# Patient Record
Sex: Male | Born: 1955
Health system: Southern US, Community
[De-identification: ages and names within clinical notes are randomized; demographics above are authoritative.]

## PROBLEM LIST (undated history)

## (undated) DIAGNOSIS — R519 Headache, unspecified: Secondary | ICD-10-CM

## (undated) DIAGNOSIS — IMO0001 Reserved for inherently not codable concepts without codable children: Secondary | ICD-10-CM

## (undated) DIAGNOSIS — I10 Essential (primary) hypertension: Secondary | ICD-10-CM

## (undated) DIAGNOSIS — F1011 Alcohol abuse, in remission: Secondary | ICD-10-CM

## (undated) DIAGNOSIS — R51 Headache: Secondary | ICD-10-CM

## (undated) DIAGNOSIS — I639 Cerebral infarction, unspecified: Secondary | ICD-10-CM

## (undated) DIAGNOSIS — G5602 Carpal tunnel syndrome, left upper limb: Secondary | ICD-10-CM

## (undated) HISTORY — PX: COLONOSCOPY: SHX174

## (undated) HISTORY — PX: SHOULDER SURGERY: SHX246

---

## 2008-08-21 ENCOUNTER — Emergency Department (HOSPITAL_COMMUNITY): Admission: EM | Admit: 2008-08-21 | Discharge: 2008-08-21 | Payer: Self-pay | Admitting: Emergency Medicine

## 2008-09-06 ENCOUNTER — Emergency Department (HOSPITAL_COMMUNITY): Admission: EM | Admit: 2008-09-06 | Discharge: 2008-09-06 | Payer: Self-pay | Admitting: Emergency Medicine

## 2008-09-09 ENCOUNTER — Emergency Department (HOSPITAL_COMMUNITY): Admission: EM | Admit: 2008-09-09 | Discharge: 2008-09-09 | Payer: Self-pay | Admitting: Emergency Medicine

## 2011-08-06 LAB — URINALYSIS, ROUTINE W REFLEX MICROSCOPIC
Hgb urine dipstick: NEGATIVE
Protein, ur: NEGATIVE
Protein, ur: NEGATIVE
Urobilinogen, UA: 0.2
pH: 6

## 2011-08-06 LAB — CBC
HCT: 42.5
MCHC: 34.7
MCHC: 34.5
MCV: 94.8
MCV: 95.4
Platelets: 278
RBC: 4.49
RDW: 13.9
WBC: 6.4

## 2011-08-06 LAB — DIFFERENTIAL
Basophils Absolute: 0.2 — ABNORMAL HIGH
Basophils Absolute: 0.1
Basophils Relative: 2 — ABNORMAL HIGH
Basophils Relative: 1
Eosinophils Absolute: 0.1
Eosinophils Relative: 1
Lymphocytes Relative: 27
Lymphs Abs: 1.9
Monocytes Absolute: 0.4
Monocytes Relative: 5

## 2011-08-06 LAB — COMPREHENSIVE METABOLIC PANEL
ALT: 13
BUN: 8
CO2: 26
Calcium: 9.2
Chloride: 105
Creatinine, Ser: 0.98
GFR calc Af Amer: >60
GFR calc non Af Amer: >60
Glucose, Bld: 92
Potassium: 3.9
Sodium: 138
Total Bilirubin: 0.8

## 2011-08-06 LAB — URINE CULTURE: Colony Count: NO GROWTH

## 2012-05-24 DIAGNOSIS — R109 Unspecified abdominal pain: Secondary | ICD-10-CM

## 2013-05-02 DIAGNOSIS — R55 Syncope and collapse: Secondary | ICD-10-CM

## 2014-11-04 DIAGNOSIS — Z8673 Personal history of transient ischemic attack (TIA), and cerebral infarction without residual deficits: Secondary | ICD-10-CM | POA: Insufficient documentation

## 2014-11-04 DIAGNOSIS — I639 Cerebral infarction, unspecified: Secondary | ICD-10-CM

## 2014-11-04 HISTORY — DX: Cerebral infarction, unspecified: I63.9

## 2016-02-03 DEATH — deceased

## 2016-05-14 ENCOUNTER — Inpatient Hospital Stay (HOSPITAL_COMMUNITY)
Admission: RE | Admit: 2016-05-14 | Discharge: 2016-05-14 | Disposition: A | Discharge status: Home / Self Care | Payer: Medicaid Other | Source: Ambulatory Visit

## 2016-07-09 ENCOUNTER — Encounter (HOSPITAL_COMMUNITY)
Admission: RE | Admit: 2016-07-09 | Discharge: 2016-07-09 | Disposition: A | Discharge status: Home / Self Care | Payer: Medicaid Other | Source: Ambulatory Visit | Attending: Oral Surgery | Admitting: Oral Surgery

## 2016-07-09 ENCOUNTER — Encounter (HOSPITAL_COMMUNITY): Payer: Self-pay

## 2016-07-09 DIAGNOSIS — Z01812 Encounter for preprocedural laboratory examination: Secondary | ICD-10-CM | POA: Diagnosis present

## 2016-07-09 DIAGNOSIS — I10 Essential (primary) hypertension: Secondary | ICD-10-CM | POA: Diagnosis not present

## 2016-07-09 DIAGNOSIS — Z01810 Encounter for preprocedural cardiovascular examination: Secondary | ICD-10-CM | POA: Diagnosis not present

## 2016-07-09 HISTORY — DX: Cerebral infarction, unspecified: I63.9

## 2016-07-09 HISTORY — DX: Headache, unspecified: R51.9

## 2016-07-09 HISTORY — DX: Essential (primary) hypertension: I10

## 2016-07-09 HISTORY — DX: Headache: R51

## 2016-07-09 HISTORY — DX: Alcohol abuse, in remission: F10.11

## 2016-07-09 HISTORY — DX: Reserved for inherently not codable concepts without codable children: IMO0001

## 2016-07-09 HISTORY — DX: Carpal tunnel syndrome, left upper limb: G56.02

## 2016-07-09 LAB — CBC
HEMATOCRIT: 41.5 % (ref 39.0–52.0)
Hemoglobin: 13.8 g/dL (ref 13.0–17.0)
MCH: 29.6 pg (ref 26.0–34.0)
MCHC: 33.3 g/dL (ref 30.0–36.0)
MCV: 89.1 fL (ref 78.0–100.0)
PLATELETS: 221 10*3/uL (ref 150–400)
RBC: 4.66 MIL/uL (ref 4.22–5.81)
RDW: 13.8 % (ref 11.5–15.5)
WBC: 7.8 10*3/uL (ref 4.0–10.5)

## 2016-07-09 LAB — COMPREHENSIVE METABOLIC PANEL
ALBUMIN: 3.7 g/dL (ref 3.5–5.0)
ALT: 15 U/L — ABNORMAL LOW (ref 17–63)
ANION GAP: 6 (ref 5–15)
AST: 19 U/L (ref 15–41)
Alkaline Phosphatase: 57 U/L (ref 38–126)
BUN: 13 mg/dL (ref 6–20)
CHLORIDE: 110 mmol/L (ref 101–111)
CO2: 23 mmol/L (ref 22–32)
Calcium: 9.5 mg/dL (ref 8.9–10.3)
Creatinine, Ser: 1.03 mg/dL (ref 0.61–1.24)
GFR calc Af Amer: >60 mL/min (ref >60)
GFR calc non Af Amer: >60 mL/min (ref >60)
GLUCOSE: 94 mg/dL (ref 65–99)
POTASSIUM: 3.9 mmol/L (ref 3.5–5.1)
SODIUM: 139 mmol/L (ref 135–145)
Total Bilirubin: 0.4 mg/dL (ref 0.3–1.2)
Total Protein: 7.5 g/dL (ref 6.5–8.1)

## 2016-07-09 NOTE — Progress Notes (Signed)
PCP - Dr. Avon Gullyesfaye Fanta Cardiologist - denies  EKG - 07/09/16 Echo - 2016 - Care Everywhere Cardiac Event Monitor - 07/2015 - Care Everywhere  Stress test/Cardiac Cath - denies  Patient denies chest pain and shortness of breath at PAT appointment.

## 2016-07-09 NOTE — Pre-Procedure Instructions (Signed)
    Richard Madden  07/09/2016      [facility] [place] - [address] [place] [id number] Phone: [phone] Fax: [phone]    Your procedure is scheduled on Friday, September 8th, 2017.  [facility] Ad[facility] at 8:00 A.M.  Call this number if you have problems the morning of surgery:  [phone]   Remember:  Do not eat food or drink liquids after midnight.   Take these medicines the morning of surgery with A SIP OF WATER: None.  Stop taking: Aspirin, NSAIDS, Aleve, Naproxen, Ibuprofen, Advil, Motrin, BC's, Goody's, Fish oil, all herbal medications, and all vitamins.    Do not wear jewelry.  Do not wear lotions, powders, or colognes, or deoderant.  Men may shave face and neck.  Do not bring valuables to the hospital.  [facility] is not responsible for any belongings or valuables.  Contacts, dentures or bridgework may not be worn into surgery.  Leave your suitcase in the car.  After surgery it may be brought to your room.  For patients admitted to the hospital, discharge time will be determined by your treatment team.  Patients discharged the day of surgery will not be allowed to drive home.   Special instructions:  Preparing for Surgery.   Please read over the following fact sheets that you were given.   [facility]- Preparing For Surgery  Before surgery, you can play an important role. Because skin is not sterile, your skin needs to be as free of germs as possible. You can reduce the number of germs on your skin by washing with CHG (chlorahexidine gluconate) Soap before surgery.  CHG is an antiseptic cleaner which kills germs and bonds with the skin to continue killing germs even after washing.  Please do not use if you have an allergy to CHG or antibacterial soaps. If your skin becomes reddened/irritated stop using the CHG.  Do not shave (including legs and underarms) for at least 48 hours prior to first CHG shower. It  is OK to shave your face.  Please follow these instructions carefully.   1. Shower the NIGHT BEFORE SURGERY and the MORNING OF SURGERY with CHG.   2. If you chose to wash your hair, wash your hair first as usual with your normal shampoo.  3. After you shampoo, rinse your hair and body thoroughly to remove the shampoo.  4. Use CHG as you would any other liquid soap. You can apply CHG directly to the skin and wash gently with a scrungie or a clean washcloth.   5. Apply the CHG Soap to your body ONLY FROM THE NECK DOWN.  Do not use on open wounds or open sores. Avoid contact with your eyes, ears, mouth and genitals (private parts). Wash genitals (private parts) with your normal soap.  6. Wash thoroughly, paying special attention to the area where your surgery will be performed.  7. Thoroughly rinse your body with warm water from the neck down.  8. DO NOT shower/wash with your normal soap after using and rinsing off the CHG Soap.  9. Pat yourself dry with a CLEAN TOWEL.   10. Wear CLEAN PAJAMAS   11. Place CLEAN SHEETS on your bed the night of your first shower and DO NOT SLEEP WITH PETS.  Day of Surgery: Do not apply any deodorants/lotions. Please wear clean clothes to the hospital/surgery center.
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
## 2016-07-11 NOTE — Anesthesia Preprocedure Evaluation (Addendum)
Anesthesia Evaluation  Patient identified by MRN, date of birth, ID band Patient awake    Reviewed: Allergy & Precautions, NPO status , Patient's Chart, lab work & pertinent test results  History of Anesthesia Complications Negative for: history of anesthetic complications  Airway Mallampati: II  TM Distance: >3 FB Neck ROM: Full    Dental  (+) Dental Advisory Given, Poor Dentition, Chipped, Missing   Pulmonary former smoker,    Pulmonary exam normal breath sounds clear to auscultation       Cardiovascular hypertension, Pt. on medications Normal cardiovascular exam Rhythm:Regular Rate:Normal     Neuro/Psych  Headaches, CVA negative psych ROS   GI/Hepatic negative GI ROS, (+)     substance abuse  alcohol use,   Endo/Other  negative endocrine ROS  Renal/GU negative Renal ROS     Musculoskeletal negative musculoskeletal ROS (+)   Abdominal   Peds  Hematology negative hematology ROS (+)   Anesthesia Other Findings Day of surgery medications reviewed with the patient.  Reproductive/Obstetrics                           Anesthesia Physical Anesthesia Plan  ASA: III  Anesthesia Plan: General   Post-op Pain Management:    Induction: Intravenous  Airway Management Planned: Nasal ETT  Additional Equipment:   Intra-op Plan:   Post-operative Plan: Extubation in OR  Informed Consent: I have reviewed the patients History and Physical, chart, labs and discussed the procedure including the risks, benefits and alternatives for the proposed anesthesia with the patient or authorized representative who has indicated his/her understanding and acceptance.   Dental advisory given  Plan Discussed with: CRNA  Anesthesia Plan Comments: (Risks/benefits of general anesthesia discussed with patient including risk of damage to teeth, lips, gum, and tongue, nausea/vomiting, allergic reactions to  medications, and the possibility of heart attack, stroke and death.  All patient questions answered.  Patient wishes to proceed.)        Anesthesia Quick Evaluation

## 2016-07-11 NOTE — Progress Notes (Signed)
Pt daughter, Rito EhrlichShirley Martin, called the office after receiving a call and verbalized understanding that the pt new arrival time is 7:00A.M. tomorrow.

## 2016-07-12 ENCOUNTER — Encounter (HOSPITAL_COMMUNITY)
Admission: RE | Disposition: A | Discharge status: Home / Self Care | Payer: Self-pay | Source: Ambulatory Visit | Attending: Oral Surgery

## 2016-07-12 ENCOUNTER — Encounter (HOSPITAL_COMMUNITY): Payer: Self-pay | Admitting: Certified Registered Nurse Anesthetist

## 2016-07-12 ENCOUNTER — Ambulatory Visit (HOSPITAL_COMMUNITY): Payer: Medicaid Other | Admitting: Vascular Surgery

## 2016-07-12 ENCOUNTER — Ambulatory Visit (HOSPITAL_COMMUNITY)
Admission: RE | Admit: 2016-07-12 | Discharge: 2016-07-12 | Disposition: A | Discharge status: Home / Self Care | Payer: Medicaid Other | Source: Ambulatory Visit | Attending: Oral Surgery | Admitting: Oral Surgery

## 2016-07-12 DIAGNOSIS — I10 Essential (primary) hypertension: Secondary | ICD-10-CM | POA: Diagnosis not present

## 2016-07-12 DIAGNOSIS — Z7982 Long term (current) use of aspirin: Secondary | ICD-10-CM | POA: Insufficient documentation

## 2016-07-12 DIAGNOSIS — Z8673 Personal history of transient ischemic attack (TIA), and cerebral infarction without residual deficits: Secondary | ICD-10-CM | POA: Diagnosis not present

## 2016-07-12 DIAGNOSIS — Z87891 Personal history of nicotine dependence: Secondary | ICD-10-CM | POA: Insufficient documentation

## 2016-07-12 DIAGNOSIS — K029 Dental caries, unspecified: Secondary | ICD-10-CM | POA: Insufficient documentation

## 2016-07-12 HISTORY — PX: MULTIPLE EXTRACTIONS WITH ALVEOLOPLASTY: SHX5342

## 2016-07-12 SURGERY — MULTIPLE EXTRACTION WITH ALVEOLOPLASTY
Anesthesia: General | Site: Mouth | Laterality: Bilateral

## 2016-07-12 MED ORDER — CEFAZOLIN SODIUM 1 G IJ SOLR
INTRAMUSCULAR | Status: AC
  Filled 2016-07-12: qty 40

## 2016-07-12 MED ORDER — PHENYLEPHRINE 40 MCG/ML (10ML) SYRINGE FOR IV PUSH (FOR BLOOD PRESSURE SUPPORT)
PREFILLED_SYRINGE | INTRAVENOUS | Status: DC | PRN
  Administered 2016-07-12 (×2): 40 ug via INTRAVENOUS

## 2016-07-12 MED ORDER — ONDANSETRON HCL 4 MG/2ML IJ SOLN
INTRAMUSCULAR | Status: AC
  Filled 2016-07-12: qty 4

## 2016-07-12 MED ORDER — OXYCODONE-ACETAMINOPHEN 5-325 MG PO TABS: 1.0000 | ORAL_TABLET | ORAL | 0 refills | Status: DC | PRN

## 2016-07-12 MED ORDER — SODIUM CHLORIDE 0.9 % IJ SOLN
INTRAMUSCULAR | Status: AC
  Filled 2016-07-12: qty 20

## 2016-07-12 MED ORDER — FENTANYL CITRATE (PF) 100 MCG/2ML IJ SOLN
INTRAMUSCULAR | Status: AC
  Filled 2016-07-12: qty 2

## 2016-07-12 MED ORDER — LIDOCAINE-EPINEPHRINE 2 %-1:100000 IJ SOLN
INTRAMUSCULAR | Status: AC
  Filled 2016-07-12: qty 1

## 2016-07-12 MED ORDER — SUCCINYLCHOLINE CHLORIDE 200 MG/10ML IV SOSY
PREFILLED_SYRINGE | INTRAVENOUS | Status: AC
  Filled 2016-07-12: qty 10

## 2016-07-12 MED ORDER — MIDAZOLAM HCL 2 MG/2ML IJ SOLN
INTRAMUSCULAR | Status: AC
  Filled 2016-07-12: qty 2

## 2016-07-12 MED ORDER — PROPOFOL 10 MG/ML IV BOLUS
INTRAVENOUS | Status: DC | PRN
  Administered 2016-07-12: 150 mg via INTRAVENOUS
  Administered 2016-07-12: 30 mg via INTRAVENOUS

## 2016-07-12 MED ORDER — LIDOCAINE-EPINEPHRINE 2 %-1:100000 IJ SOLN
INTRAMUSCULAR | Status: DC | PRN
  Administered 2016-07-12: 17 mL

## 2016-07-12 MED ORDER — OXYMETAZOLINE HCL 0.05 % NA SOLN
NASAL | Status: AC
  Filled 2016-07-12: qty 15

## 2016-07-12 MED ORDER — FENTANYL CITRATE (PF) 100 MCG/2ML IJ SOLN
INTRAMUSCULAR | Status: DC | PRN
  Administered 2016-07-12: 25 ug via INTRAVENOUS
  Administered 2016-07-12: 50 ug via INTRAVENOUS
  Administered 2016-07-12: 25 ug via INTRAVENOUS
  Administered 2016-07-12 (×2): 50 ug via INTRAVENOUS

## 2016-07-12 MED ORDER — CEFAZOLIN SODIUM 1 G IJ SOLR
INTRAMUSCULAR | Status: DC | PRN
  Administered 2016-07-12: 2 g via INTRAMUSCULAR

## 2016-07-12 MED ORDER — ONDANSETRON HCL 4 MG/2ML IJ SOLN
INTRAMUSCULAR | Status: DC | PRN
  Administered 2016-07-12: 4 mg via INTRAVENOUS

## 2016-07-12 MED ORDER — SUCCINYLCHOLINE CHLORIDE 200 MG/10ML IV SOSY
PREFILLED_SYRINGE | INTRAVENOUS | Status: DC | PRN
  Administered 2016-07-12: 100 mg via INTRAVENOUS

## 2016-07-12 MED ORDER — LACTATED RINGERS IV SOLN
INTRAVENOUS | Status: DC
  Administered 2016-07-12 (×3): via INTRAVENOUS

## 2016-07-12 MED ORDER — SODIUM CHLORIDE 0.9 % IR SOLN
Status: DC | PRN
  Administered 2016-07-12: 1000 mL

## 2016-07-12 MED ORDER — MIDAZOLAM HCL 5 MG/5ML IJ SOLN
INTRAMUSCULAR | Status: DC | PRN
  Administered 2016-07-12: 2 mg via INTRAVENOUS

## 2016-07-12 MED ORDER — 0.9 % SODIUM CHLORIDE (POUR BTL) OPTIME
TOPICAL | Status: DC | PRN
  Administered 2016-07-12: 1000 mL

## 2016-07-12 MED ORDER — ONDANSETRON HCL 4 MG/2ML IJ SOLN: 4.0000 mg | Freq: Once | INTRAMUSCULAR | Status: DC | PRN

## 2016-07-12 MED ORDER — PROPOFOL 10 MG/ML IV BOLUS
INTRAVENOUS | Status: AC
  Filled 2016-07-12: qty 20

## 2016-07-12 MED ORDER — LIDOCAINE 2% (20 MG/ML) 5 ML SYRINGE
INTRAMUSCULAR | Status: DC | PRN
  Administered 2016-07-12: 100 mg via INTRAVENOUS

## 2016-07-12 MED ORDER — LIDOCAINE 2% (20 MG/ML) 5 ML SYRINGE
INTRAMUSCULAR | Status: AC
  Filled 2016-07-12: qty 10

## 2016-07-12 MED ORDER — FENTANYL CITRATE (PF) 100 MCG/2ML IJ SOLN
25.0000 ug | INTRAMUSCULAR | Status: DC | PRN
  Administered 2016-07-12 (×2): 50 ug via INTRAVENOUS

## 2016-07-12 SURGICAL SUPPLY — 28 items
BUR CROSS CUT FISSURE 1.6 (BURR) ×2 IMPLANT
BUR CROSS CUT FISSURE 1.6MM (BURR) ×1
BUR EGG ELITE 4.0 (BURR) ×2 IMPLANT
BUR EGG ELITE 4.0MM (BURR) ×1
CANISTER SUCTION 2500CC (MISCELLANEOUS) ×3 IMPLANT
COVER SURGICAL LIGHT HANDLE (MISCELLANEOUS) ×3 IMPLANT
CRADLE DONUT ADULT HEAD (MISCELLANEOUS) ×3 IMPLANT
DRAPE U-SHAPE 76X120 STRL (DRAPES) ×3 IMPLANT
FLUID NSS /IRRIG 1000 ML XXX (MISCELLANEOUS) ×3 IMPLANT
GAUZE PACKING FOLDED 2  STR (GAUZE/BANDAGES/DRESSINGS) ×2
GAUZE PACKING FOLDED 2 STR (GAUZE/BANDAGES/DRESSINGS) ×1 IMPLANT
GLOVE BIO SURGEON STRL SZ 6.5 (GLOVE) ×2 IMPLANT
GLOVE BIO SURGEON STRL SZ7.5 (GLOVE) ×3 IMPLANT
GLOVE BIO SURGEONS STRL SZ 6.5 (GLOVE) ×1
GOWN STRL REUS W/ TWL LRG LVL3 (GOWN DISPOSABLE) ×1 IMPLANT
GOWN STRL REUS W/ TWL XL LVL3 (GOWN DISPOSABLE) ×1 IMPLANT
GOWN STRL REUS W/TWL LRG LVL3 (GOWN DISPOSABLE) ×2
GOWN STRL REUS W/TWL XL LVL3 (GOWN DISPOSABLE) ×2
KIT BASIN OR (CUSTOM PROCEDURE TRAY) ×3 IMPLANT
KIT ROOM TURNOVER OR (KITS) ×3 IMPLANT
NEEDLE 22X1 1/2 (OR ONLY) (NEEDLE) ×6 IMPLANT
NS IRRIG 1000ML POUR BTL (IV SOLUTION) ×3 IMPLANT
PAD ARMBOARD 7.5X6 YLW CONV (MISCELLANEOUS) ×3 IMPLANT
SUT CHROMIC 3 0 PS 2 (SUTURE) ×6 IMPLANT
SYR CONTROL 10ML LL (SYRINGE) ×3 IMPLANT
TRAY ENT MC OR (CUSTOM PROCEDURE TRAY) ×3 IMPLANT
TUBING IRRIGATION (MISCELLANEOUS) ×3 IMPLANT
YANKAUER SUCT BULB TIP NO VENT (SUCTIONS) ×3 IMPLANT

## 2016-07-12 NOTE — Anesthesia Postprocedure Evaluation (Signed)
Anesthesia Post Note  Patient: Richard Madden  Procedure(s) Performed: Procedure(s) (LRB): MULTIPLE EXTRACTION WITH ALVEOLOPLASTY BILATERAL (Bilateral)  Patient location during evaluation: PACU Anesthesia Type: General Level of consciousness: awake and alert Pain management: pain level controlled Vital Signs Assessment: post-procedure vital signs reviewed and stable Respiratory status: spontaneous breathing, nonlabored ventilation, respiratory function stable and patient connected to nasal cannula oxygen Cardiovascular status: blood pressure returned to baseline and stable Postop Assessment: no signs of nausea or vomiting Anesthetic complications: no    Last Vitals:  Vitals:   07/12/16 1030 07/12/16 1040  BP: 121/80 (!) 141/92  Pulse: 75 82  Resp: 13   Temp: 36.3 C     Last Pain:  Vitals:   07/12/16 1040  TempSrc:   PainSc: 2                  Richard Madden

## 2016-07-12 NOTE — Anesthesia Procedure Notes (Signed)
Procedure Name: Intubation Date/Time: 07/12/2016 8:52 AM Performed by: Annabelle HarmanSMITH, Roylene Heaton A Pre-anesthesia Checklist: Emergency Drugs available, Patient identified, Suction available and Patient being monitored Patient Re-evaluated:Patient Re-evaluated prior to inductionOxygen Delivery Method: Circle system utilized Preoxygenation: Pre-oxygenation with 100% oxygen Intubation Type: IV induction Ventilation: Mask ventilation without difficulty Laryngoscope Size: Miller and 2 Grade View: Grade I Nasal Tubes: Left, Nasal prep performed, Nasal Rae and Magill forceps- large, utilized Tube size: 7.0 mm Number of attempts: 1 Airway Equipment and Method: Stylet Tube secured with: Tape Dental Injury: Teeth and Oropharynx as per pre-operative assessment  Comments: Tape at bend in nasal RAE tube

## 2016-07-12 NOTE — H&P (Signed)
H&P documentation  -History and Physical Reviewed  -Patient has been re-examined  -No change in the plan of care  Richard Madden  

## 2016-07-12 NOTE — Op Note (Signed)
07/12/2016  9:28 AM  PATIENT:  Richard Madden  60 y.o. male  PRE-OPERATIVE DIAGNOSIS:  NON RESTORABLE TEETH # 3, 7, 8, 9, 10, 11, 14, 15, 16, 19, 22, 23, 26, 27  POST-OPERATIVE DIAGNOSIS:  SAME  PROCEDURE:  Procedure(s): MULTIPLE EXTRACTION TEETH # 3, 7, 8, 9, 10, 11, 14, 15, 16, 19, 22, 23, 26, 27;WITH ALVEOLOPLASTY BILATERAL  SURGEON:  Surgeon(s): Ocie DoyneScott Blanca Carreon, DDS  ANESTHESIA:   local and general  EBL:  minimal  DRAINS: none   SPECIMEN:  No Specimen  COUNTS:  YES  PLAN OF CARE: Discharge to home after PACU  PATIENT DISPOSITION:  PACU - hemodynamically stable.   PROCEDURE DETAILS: Dictation # [id number]  Richard LopesScott M. Kynzlie Madden, DMD 07/12/2016 9:28 AM

## 2016-07-12 NOTE — Transfer of Care (Signed)
Immediate Anesthesia Transfer of Care Note  Patient: Richard LawrenceJames M Brockway  Procedure(s) Performed: Procedure(s): MULTIPLE EXTRACTION WITH ALVEOLOPLASTY BILATERAL (Bilateral)  Patient Location: PACU  Anesthesia Type:General  Level of Consciousness: awake, alert , oriented and patient cooperative  Airway & Oxygen Therapy: Patient Spontanous Breathing and Patient connected to face mask oxygen  Post-op Assessment: Report given to RN and Post -op Vital signs reviewed and stable  Post vital signs: Reviewed and stable  Last Vitals:  Vitals:   07/12/16 0654 07/12/16 0945  BP: (!) 168/90   Pulse: 71   Resp: 20   Temp: 36.6 C 36.1 C    Last Pain:  Vitals:   07/12/16 0817  TempSrc:   PainSc: 0-No pain         Complications: No apparent anesthesia complications

## 2016-07-12 NOTE — Progress Notes (Signed)
Reporting to Parker Hannifinaylor RN as primary

## 2016-07-13 NOTE — Op Note (Signed)
NAME:  Jill PolingHOMPSON, Shay              ACCOUNT NO.:  [id number]  MEDICAL RECORD NO.:  [id number]  LOCATION:  [facility]                         FACILITY:  [facility]  PHYSICIAN:  Georgia LopesScott M. Ying Blankenhorn, M.D.  DATE OF BIRTH:  [birth year]  DATE OF PROCEDURE:  07/12/2016 DATE OF DISCHARGE:  07/12/2016                              OPERATIVE REPORT   POSTOPERATIVE DIAGNOSIS:  Nonrestorable teeth secondary to dental caries and periodontitis #3, 7, 8, 9, 10, 11, 14, 15, 16, 19, 22, 23, 26, 27.  PROCEDURE:  Extraction teeth numbers 3, 7, 9, 10, 11, 14, 15, 16, 19, 22, 23, 26, 27, alveoplasty right and left maxilla.  SURGEON:  Georgia LopesScott M. Mcdonald Reiling, M.D.  ANESTHESIA:  General nasal intubation.  DESCRIPTION OF PROCEDURE:  The patient was taken to the operating room, placed on the table in supine position.  General anesthesia was administered intravenously and a nasal endotracheal tube was placed and secured.  The eyes were protected.  The patient was draped for the procedure.  A time-out was performed.  The posterior pharynx was suctioned.  The throat pack was placed.  2% lidocaine with 1:100,000 epinephrine was infiltrated in inferior alveolar block on the right and left sides and in buccal and palatal infiltration in the maxilla around the teeth to be removed.  Additional anesthetic solution was given in the anterior mandible in the vestibule buccally.  A bite block was placed in the right side of the mouth and the left side was operated first.  #15 blade was used to make an incision around teeth #19, 22, 23, and 26, 27 in the mandible and around teeth #7, 8, 9, 10, 11, 14, 15, 16 in the maxilla.  The periosteum was reflected from around these teeth using a periosteal elevator.  Then, the teeth were elevated with a 301 elevator and removed from the mouth with a dental forceps.  Brisk hemorrhaging was experienced in the anterior mandible from small perforating arteries.  These were compressed using the rongeurs and  then alveoplasty was performed in the anterior mandible using the egg-shaped burr and bone file and in the left maxilla using the egg-shaped burr and bone file after removal of teeth #7, 8, 9, 10, 11, 14, 15, 16 with the forceps.  Then, the sockets were curetted, irrigated, and closed with 3- 0 chromic.  Then, bite block and sweetheart retractor were repositioned on either side of the mouth and a 15-blade and periosteal elevator were used to reflect the periosteum around tooth #3.  The tooth was then elevated with a 301 elevator and removed from the mouth with a dental forceps.  The sockets were curetted, irrigated, and then closed with 3-0 chromic.  The oral cavity was inspected and found to have good hemostasis and contour, and the oral cavity was irrigated, suctioned. The throat pack was removed.  The patient was awakened, taken to the recovery room, breathing spontaneously in good condition.  ESTIMATED BLOOD LOSS:  50 mL.  COMPLICATIONS:  None.  SPECIMENS:  None.     Georgia LopesScott M. Devan Babino, M.D.     SMJ/MEDQ  D:  07/12/2016  T:  07/13/2016  Job:  [id number]

## 2016-07-15 ENCOUNTER — Encounter (HOSPITAL_COMMUNITY): Payer: Self-pay | Admitting: Oral Surgery

## 2018-01-27 ENCOUNTER — Encounter (HOSPITAL_COMMUNITY): Payer: Self-pay

## 2018-01-28 ENCOUNTER — Other Ambulatory Visit: Payer: Self-pay

## 2018-01-28 ENCOUNTER — Ambulatory Visit (HOSPITAL_COMMUNITY)
Admission: RE | Admit: 2018-01-28 | Discharge: 2018-01-28 | Disposition: A | Discharge status: Home / Self Care | Payer: Medicaid Other | Source: Ambulatory Visit | Attending: Internal Medicine | Admitting: Internal Medicine

## 2018-01-28 DIAGNOSIS — I10 Essential (primary) hypertension: Secondary | ICD-10-CM | POA: Insufficient documentation

## 2018-03-08 ENCOUNTER — Observation Stay (HOSPITAL_COMMUNITY)
Admission: EM | Admit: 2018-03-08 | Discharge: 2018-03-09 | Disposition: A | Discharge status: Home / Self Care | Payer: Medicaid Other | Attending: Internal Medicine | Admitting: Internal Medicine

## 2018-03-08 ENCOUNTER — Observation Stay (HOSPITAL_COMMUNITY): Payer: Medicaid Other

## 2018-03-08 ENCOUNTER — Other Ambulatory Visit: Payer: Self-pay

## 2018-03-08 ENCOUNTER — Encounter (HOSPITAL_COMMUNITY): Payer: Self-pay | Admitting: Emergency Medicine

## 2018-03-08 DIAGNOSIS — E785 Hyperlipidemia, unspecified: Secondary | ICD-10-CM

## 2018-03-08 DIAGNOSIS — R42 Dizziness and giddiness: Secondary | ICD-10-CM

## 2018-03-08 DIAGNOSIS — H532 Diplopia: Secondary | ICD-10-CM | POA: Diagnosis not present

## 2018-03-08 DIAGNOSIS — I639 Cerebral infarction, unspecified: Secondary | ICD-10-CM

## 2018-03-08 DIAGNOSIS — I951 Orthostatic hypotension: Secondary | ICD-10-CM | POA: Diagnosis not present

## 2018-03-08 DIAGNOSIS — I10 Essential (primary) hypertension: Secondary | ICD-10-CM | POA: Diagnosis not present

## 2018-03-08 DIAGNOSIS — Z87891 Personal history of nicotine dependence: Secondary | ICD-10-CM | POA: Diagnosis not present

## 2018-03-08 DIAGNOSIS — Z79899 Other long term (current) drug therapy: Secondary | ICD-10-CM | POA: Diagnosis not present

## 2018-03-08 DIAGNOSIS — Z8673 Personal history of transient ischemic attack (TIA), and cerebral infarction without residual deficits: Secondary | ICD-10-CM | POA: Diagnosis not present

## 2018-03-08 LAB — COMPREHENSIVE METABOLIC PANEL
ALBUMIN: 3.5 g/dL (ref 3.5–5.0)
ALT: 22 U/L (ref 17–63)
AST: 21 U/L (ref 15–41)
Alkaline Phosphatase: 59 U/L (ref 38–126)
Anion gap: 7 (ref 5–15)
BILIRUBIN TOTAL: 0.8 mg/dL (ref 0.3–1.2)
BUN: 17 mg/dL (ref 6–20)
CO2: 24 mmol/L (ref 22–32)
CREATININE: 1.17 mg/dL (ref 0.61–1.24)
Calcium: 8.9 mg/dL (ref 8.9–10.3)
Chloride: 106 mmol/L (ref 101–111)
GFR calc Af Amer: >60 mL/min (ref >60)
GLUCOSE: 129 mg/dL — AB (ref 65–99)
POTASSIUM: 3.3 mmol/L — AB (ref 3.5–5.1)
Sodium: 137 mmol/L (ref 135–145)
TOTAL PROTEIN: 7.1 g/dL (ref 6.5–8.1)

## 2018-03-08 LAB — CBC WITH DIFFERENTIAL/PLATELET
BASOS ABS: 0.1 10*3/uL (ref 0.0–0.1)
BASOS PCT: 1 %
Eosinophils Absolute: 0 10*3/uL (ref 0.0–0.7)
Eosinophils Relative: 0 %
HEMATOCRIT: 42.2 % (ref 39.0–52.0)
HEMOGLOBIN: 14.1 g/dL (ref 13.0–17.0)
Lymphocytes Relative: 19 %
Lymphs Abs: 1.7 10*3/uL (ref 0.7–4.0)
MCH: 29.7 pg (ref 26.0–34.0)
MCHC: 33.4 g/dL (ref 30.0–36.0)
MCV: 88.8 fL (ref 78.0–100.0)
Monocytes Absolute: 0.5 10*3/uL (ref 0.1–1.0)
Monocytes Relative: 6 %
NEUTROS ABS: 6.4 10*3/uL (ref 1.7–7.7)
NEUTROS PCT: 74 %
Platelets: 226 10*3/uL (ref 150–400)
RBC: 4.75 MIL/uL (ref 4.22–5.81)
RDW: 13.3 % (ref 11.5–15.5)
WBC: 8.7 10*3/uL (ref 4.0–10.5)

## 2018-03-08 MED ORDER — IOPAMIDOL (ISOVUE-370) INJECTION 76%
100.0000 mL | Freq: Once | INTRAVENOUS | Status: AC | PRN
  Administered 2018-03-08: 80 mL via INTRAVENOUS

## 2018-03-08 MED ORDER — SENNOSIDES-DOCUSATE SODIUM 8.6-50 MG PO TABS: 1.0000 | ORAL_TABLET | Freq: Every evening | ORAL | Status: DC | PRN

## 2018-03-08 MED ORDER — ACETAMINOPHEN 650 MG RE SUPP: 650.0000 mg | RECTAL | Status: DC | PRN

## 2018-03-08 MED ORDER — ACETAMINOPHEN 325 MG PO TABS
650.0000 mg | ORAL_TABLET | ORAL | Status: DC | PRN
  Administered 2018-03-09: 650 mg via ORAL
  Filled 2018-03-08: qty 2

## 2018-03-08 MED ORDER — SODIUM CHLORIDE 0.9 % IV BOLUS
1000.0000 mL | Freq: Once | INTRAVENOUS | Status: AC
  Administered 2018-03-08: 1000 mL via INTRAVENOUS

## 2018-03-08 MED ORDER — ACETAMINOPHEN 160 MG/5ML PO SOLN: 650.0000 mg | ORAL | Status: DC | PRN

## 2018-03-08 MED ORDER — HYDRALAZINE HCL 20 MG/ML IJ SOLN: 5.0000 mg | INTRAMUSCULAR | Status: DC | PRN

## 2018-03-08 MED ORDER — SIMVASTATIN 20 MG PO TABS
40.0000 mg | ORAL_TABLET | Freq: Every day | ORAL | Status: DC
  Administered 2018-03-09: 40 mg via ORAL
  Filled 2018-03-08: qty 2

## 2018-03-08 MED ORDER — STROKE: EARLY STAGES OF RECOVERY BOOK
Freq: Once | Status: AC
  Administered 2018-03-09: 02:00:00

## 2018-03-08 MED ORDER — ENOXAPARIN SODIUM 40 MG/0.4ML ~~LOC~~ SOLN
40.0000 mg | SUBCUTANEOUS | Status: DC
  Administered 2018-03-09: 40 mg via SUBCUTANEOUS
  Filled 2018-03-08: qty 0.4

## 2018-03-08 NOTE — ED Notes (Signed)
To CT

## 2018-03-08 NOTE — ED Notes (Signed)
Report to Marcelino Duster, RN  Part of report is should there be a need to contact Dr Adrian Blackwater please call him Re CT
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
## 2018-03-08 NOTE — ED Notes (Signed)
Out of bed to BR 

## 2018-03-08 NOTE — ED Notes (Signed)
Call for report  RN will call back 

## 2018-03-08 NOTE — ED Notes (Signed)
Family continues to be concerned that pt is having a stroke

## 2018-03-08 NOTE — ED Notes (Signed)
To CT via stretcher

## 2018-03-08 NOTE — ED Triage Notes (Signed)
Was seen at [facility] for near syncope 0900-1600 HTN meds have been changed  Was given fluids and CT this am  Daughters concerned that pt at home had BP 60/30 Upon returning home   Also pt complains of change of vision and his eye is moving independently from the other which they report as new

## 2018-03-09 ENCOUNTER — Observation Stay (HOSPITAL_COMMUNITY): Payer: Medicaid Other

## 2018-03-09 ENCOUNTER — Other Ambulatory Visit: Payer: Self-pay

## 2018-03-09 ENCOUNTER — Observation Stay (HOSPITAL_BASED_OUTPATIENT_CLINIC_OR_DEPARTMENT_OTHER): Payer: Medicaid Other

## 2018-03-09 DIAGNOSIS — I639 Cerebral infarction, unspecified: Secondary | ICD-10-CM

## 2018-03-09 DIAGNOSIS — R42 Dizziness and giddiness: Secondary | ICD-10-CM

## 2018-03-09 DIAGNOSIS — I5030 Unspecified diastolic (congestive) heart failure: Secondary | ICD-10-CM | POA: Diagnosis not present

## 2018-03-09 LAB — ECHOCARDIOGRAM COMPLETE
Height: 65 in
Weight: 2095.25 oz

## 2018-03-09 LAB — LIPID PANEL
Cholesterol: 112 mg/dL (ref 0–200)
HDL: 44 mg/dL (ref >40)
LDL CALC: 61 mg/dL (ref 0–99)
TRIGLYCERIDES: 34 mg/dL (ref <150)
Total CHOL/HDL Ratio: 2.5 RATIO
VLDL: 7 mg/dL (ref 0–40)

## 2018-03-09 LAB — HEMOGLOBIN A1C
Hgb A1c MFr Bld: 5.7 % — ABNORMAL HIGH (ref 4.8–5.6)
Mean Plasma Glucose: 116.89 mg/dL

## 2018-03-09 LAB — VITAMIN B12: Vitamin B-12: 361 pg/mL (ref 180–914)

## 2018-03-09 LAB — TSH: TSH: 3.153 u[IU]/mL (ref 0.350–4.500)

## 2018-03-09 MED ORDER — ASPIRIN 81 MG PO CHEW
324.0000 mg | CHEWABLE_TABLET | Freq: Every day | ORAL | Status: DC
  Administered 2018-03-09: 324 mg via ORAL
  Filled 2018-03-09: qty 4

## 2018-03-09 MED ORDER — CLOPIDOGREL BISULFATE 75 MG PO TABS: 75.0000 mg | ORAL_TABLET | Freq: Every day | ORAL | 2 refills | Status: AC

## 2018-03-09 MED ORDER — STROKE: EARLY STAGES OF RECOVERY BOOK
Freq: Once | Status: AC
  Administered 2018-03-09: 14:00:00
  Filled 2018-03-09: qty 1

## 2018-03-09 NOTE — Progress Notes (Signed)
Echocardiogram 2D Echocardiogram has been performed.  Pieter Partridge 03/09/2018, 3:01 PM

## 2018-03-09 NOTE — Progress Notes (Signed)
Patient given discharge instructions at bedside, IV removed, tolerated well.

## 2018-03-09 NOTE — Discharge Summary (Signed)
Physician Discharge Summary  KIRILL CHATTERJEE ZOX:[id number] DOB: 1956-10-06 DOA: 03/08/2018  PCP: Avon Gully, MD  Admit date: 03/08/2018 Discharge date: 03/09/2018  Time spent: 45 minutes  Recommendations for Outpatient Follow-up:  -To be discharged home today. -Advised to follow-up with PCP in 2 weeks. -Also should follow-up with his ophthalmologist and his neurologist within the next 2 to 3 weeks.  Discharge Diagnoses:  Principal Problem:   Dizziness Active Problems:   Hypertension   Diplopia   Hyperlipidemia   Orthostasis   Discharge Condition: Stable and improved  Filed Weights   03/08/18 1850 03/08/18 2320  Weight: 59.9 kg (132 lb) 59.4 kg (130 lb 15.3 oz)    History of present illness:  As per Dr. Adrian Blackwater on 5/5: CARL BUTNER is a 62 y.o. male with a history of hypertension, hyperlipidemia, history of stroke in 2016.  Patient had presented at that time with multiple falls with the MRI showing left cerebellar subacute infarcts.  Additionally, the patient had been seen at [facility] for diplopia and was diagnosed with a 3rd nerve palsy on the right.  MRI did not show any acute focal abnormality.  Last night, the patient began having dizziness.  As this had continued in the morning, the patient went to [facility] for evaluation.  He was found to be hypotensive.  He was given IV fluids.  He also had a CT around noon, but around 3:00 the patient began having diplopia with difficulty controlling the movements of his left eye.  Patient was released from the hospital, told to discontinue his metoprolol and start hydralazine 10 mg once a day.  When patient arrived at home, he was found to be hypotensive there was brought here for evaluation.  Patient received 2 L of IV fluids with good improvement, although due to family concerns,  I was asked to evaluate the patient for observation.  Patient continues to have diplopia.  His dizziness is worse when he was up and  moving around, although continues to have some dizziness at rest.  This is improved since receiving the 2 L of IV fluids.  He also reports difficulty in controlling the movements of his left eye.  He did have a headache earlier, although this is improved.  He reports no changes to his blood pressure medications over the past couple of weeks, other than that which was done at [facility] earlier today.    Hospital Course:   Acute CVA -MRI shows small area of infarct in the right posterior midbrain just above the fourth ventricle with chronic ischemic changes in the white matter, basal ganglia, pons and left cerebellum. -Carotid Dopplers with mild stenosis of bilateral ICAs with patent vertebral arteries. -2D echo shows an ejection fraction of 65 to 70% with normal wall motion and grade 1 diastolic dysfunction.  No PFO identified. -Patient has significant diplopia as his main side effect from this event. -Seen by neurology. -We will add Plavix to aspirin for secondary stroke prevention. -Patient advised to follow-up with his outpatient neurologist and ophthalmologist. -  Evaluated by therapy services without need for outpatient follow-up.  Hyperlipidemia -Continue statin.   Procedures:  As above  Consultations:  Neurology  Discharge Instructions  Discharge Instructions    Diet - low sodium heart healthy   Complete by:  As directed    Increase activity slowly   Complete by:  As directed      Allergies as of 03/09/2018      Reactions  No Known Allergies       Medication List    STOP taking these medications   lisinopril 10 MG tablet Commonly known as:  PRINIVIL,ZESTRIL     TAKE these medications   clopidogrel 75 MG tablet Commonly known as:  PLAVIX Take 1 tablet (75 mg total) by mouth daily.   ECOTRIN PO Take 1 tablet by mouth daily.   hydrALAZINE 25 MG tablet Commonly known as:  APRESOLINE Take 25 mg by mouth 3 (three) times daily.   meclizine 25 MG  tablet Commonly known as:  ANTIVERT TAKE ONE TABLET BY MOUTH EVERY 6 TO 8 HOURS   metoprolol tartrate 25 MG tablet Commonly known as:  LOPRESSOR Take 25 mg by mouth 2 (two) times daily.   oxyCODONE-acetaminophen 5-325 MG tablet Commonly known as:  PERCOCET Take 1-2 tablets by mouth every 4 (four) hours as needed for severe pain.   simvastatin 40 MG tablet Commonly known as:  ZOCOR Take 40 mg by mouth daily.      Allergies  Allergen Reactions  . No Known Allergies    Follow-up Information    Avon Gully, MD. Schedule an appointment as soon as possible for a visit in 2 week(s).   Specialty:  Internal Medicine Contact information: [address] [place] [address] [phone]            The results of significant diagnostics from this hospitalization (including imaging, microbiology, ancillary and laboratory) are listed below for reference.    Significant Diagnostic Studies: Ct Angio Head W Or Wo Contrast  Result Date: 03/08/2018 CLINICAL DATA:  62 y/o M; 1 day of intermittent dizziness. History of stroke. EXAM: CT ANGIOGRAPHY HEAD AND NECK TECHNIQUE: Multidetector CT imaging of the head and neck was performed using the standard protocol during bolus administration of intravenous contrast. Multiplanar CT image reconstructions and MIPs were obtained to evaluate the vascular anatomy. Carotid stenosis measurements (when applicable) are obtained utilizing NASCET criteria, using the distal internal carotid diameter as the denominator. CONTRAST:  80mL ISOVUE-370 IOPAMIDOL (ISOVUE-370) INJECTION 76% COMPARISON:  03/08/2018 CT head FINDINGS: CT HEAD FINDINGS Brain: No evidence of acute infarction, hemorrhage, hydrocephalus, extra-axial collection or mass lesion/mass effect. Very small chronic infarctions in left cerebellar hemisphere and chronic lacunar infarctions within bilateral thalami are stable. Stable background of chronic microvascular ischemic changes and  parenchymal volume loss of the brain. Vascular: As below. Skull: Normal. Negative for fracture or focal lesion. Sinuses: Partial opacification of the left mastoid tip. Otherwise negative. Orbits: No acute finding. Review of the MIP images confirms the above findings CTA NECK FINDINGS Aortic arch: Standard branching. Imaged portion shows no evidence of aneurysm or dissection. No significant stenosis of the major arch vessel origins. Mild calcific atherosclerosis. Right carotid system: No evidence of dissection, stenosis (50% or greater) or occlusion. Mild non stenotic calcified plaque of the carotid bifurcation. Left carotid system: No evidence of dissection, stenosis (50% or greater) or occlusion. Mild non stenotic calcified plaque of the carotid bifurcation. Vertebral arteries: Right vertebral artery origin mixed plaque with moderate 50% stenosis. Left vertebral artery origin calcified plaque without significant stenosis. Left dominant vertebral system. No evidence for dissection, aneurysm, or occlusion. Skeleton: Negative. Other neck: Negative. Upper chest: Mild centrilobular emphysema of the lungs. Review of the MIP images confirms the above findings CTA HEAD FINDINGS Anterior circulation: No large vessel occlusion, aneurysm, or vascular malformation. Calcified plaque of the carotid siphons without significant stenosis. Mild stenosis in the left A3, left superior M2 and  right inferior M2. No high-grade stenosis. Posterior circulation: No large vessel occlusion, aneurysm, or vascular malformation. No significant stenosis of bilateral vertebral arteries, basilar artery, or posterior cerebral arteries. Tandem segments of severe stenosis in the right PICA. Venous sinuses: As permitted by contrast timing, patent. Anatomic variants: Patent left posterior communicating artery and anterior communicating artery. No right posterior communicating artery identified, likely hypoplastic or absent. Delayed phase: No abnormal  intracranial enhancement. Review of the MIP images confirms the above findings IMPRESSION: 1. No acute abnormality on noncontrast CT of head. Stable chronic microvascular ischemic changes and atrophy of the brain as well as small chronic infarcts in the thalami and left cerebellum. 2. Right vertebral artery origin moderate 50% stenosis. 3. No significant stenosis by NASCET criteria of the left vertebral artery or carotid systems. No evidence for dissection, aneurysm, or occlusion. 4. Patent anterior and posterior intracranial circulation. No large vessel occlusion, aneurysm, or vascular malformation. 5. Tandem segments of severe stenosis in right PICA. Otherwise, no high-grade stenosis of the intracranial circulation. 6. Mild centrilobular emphysema of the lungs. Electronically Signed   By: Mitzi Hansen M.D.   On: 03/08/2018 23:14   Ct Angio Neck W Or Wo Contrast  Result Date: 03/08/2018 CLINICAL DATA:  62 y/o M; 1 day of intermittent dizziness. History of stroke. EXAM: CT ANGIOGRAPHY HEAD AND NECK TECHNIQUE: Multidetector CT imaging of the head and neck was performed using the standard protocol during bolus administration of intravenous contrast. Multiplanar CT image reconstructions and MIPs were obtained to evaluate the vascular anatomy. Carotid stenosis measurements (when applicable) are obtained utilizing NASCET criteria, using the distal internal carotid diameter as the denominator. CONTRAST:  80mL ISOVUE-370 IOPAMIDOL (ISOVUE-370) INJECTION 76% COMPARISON:  03/08/2018 CT head FINDINGS: CT HEAD FINDINGS Brain: No evidence of acute infarction, hemorrhage, hydrocephalus, extra-axial collection or mass lesion/mass effect. Very small chronic infarctions in left cerebellar hemisphere and chronic lacunar infarctions within bilateral thalami are stable. Stable background of chronic microvascular ischemic changes and parenchymal volume loss of the brain. Vascular: As below. Skull: Normal. Negative for  fracture or focal lesion. Sinuses: Partial opacification of the left mastoid tip. Otherwise negative. Orbits: No acute finding. Review of the MIP images confirms the above findings CTA NECK FINDINGS Aortic arch: Standard branching. Imaged portion shows no evidence of aneurysm or dissection. No significant stenosis of the major arch vessel origins. Mild calcific atherosclerosis. Right carotid system: No evidence of dissection, stenosis (50% or greater) or occlusion. Mild non stenotic calcified plaque of the carotid bifurcation. Left carotid system: No evidence of dissection, stenosis (50% or greater) or occlusion. Mild non stenotic calcified plaque of the carotid bifurcation. Vertebral arteries: Right vertebral artery origin mixed plaque with moderate 50% stenosis. Left vertebral artery origin calcified plaque without significant stenosis. Left dominant vertebral system. No evidence for dissection, aneurysm, or occlusion. Skeleton: Negative. Other neck: Negative. Upper chest: Mild centrilobular emphysema of the lungs. Review of the MIP images confirms the above findings CTA HEAD FINDINGS Anterior circulation: No large vessel occlusion, aneurysm, or vascular malformation. Calcified plaque of the carotid siphons without significant stenosis. Mild stenosis in the left A3, left superior M2 and right inferior M2. No high-grade stenosis. Posterior circulation: No large vessel occlusion, aneurysm, or vascular malformation. No significant stenosis of bilateral vertebral arteries, basilar artery, or posterior cerebral arteries. Tandem segments of severe stenosis in the right PICA. Venous sinuses: As permitted by contrast timing, patent. Anatomic variants: Patent left posterior communicating artery and anterior communicating artery. No right posterior communicating  artery identified, likely hypoplastic or absent. Delayed phase: No abnormal intracranial enhancement. Review of the MIP images confirms the above findings  IMPRESSION: 1. No acute abnormality on noncontrast CT of head. Stable chronic microvascular ischemic changes and atrophy of the brain as well as small chronic infarcts in the thalami and left cerebellum. 2. Right vertebral artery origin moderate 50% stenosis. 3. No significant stenosis by NASCET criteria of the left vertebral artery or carotid systems. No evidence for dissection, aneurysm, or occlusion. 4. Patent anterior and posterior intracranial circulation. No large vessel occlusion, aneurysm, or vascular malformation. 5. Tandem segments of severe stenosis in right PICA. Otherwise, no high-grade stenosis of the intracranial circulation. 6. Mild centrilobular emphysema of the lungs. Electronically Signed   By: Mitzi Hansen M.D.   On: 03/08/2018 23:14   Mr Brain Wo Contrast  Result Date: 03/09/2018 CLINICAL DATA:  Ataxia.  Left-sided weakness.  Stroke. EXAM: MRI HEAD WITHOUT CONTRAST TECHNIQUE: Multiplanar, multiecho pulse sequences of the brain and surrounding structures were obtained without intravenous contrast. COMPARISON:  CT head 03/08/2018.  MRI 10/17/2017 FINDINGS: Brain: Small acute infarct in the right posterior midbrain at the level the upper fourth ventricle. No other acute infarct identified. Mild atrophy. Chronic microvascular ischemic change throughout the white matter. Chronic infarcts in the thalamus and pons bilaterally. Chronic infarcts in the left cerebellum. Negative for hemorrhage or mass lesion. Vascular: Normal arterial flow voids Skull and upper cervical spine: Negative Sinuses/Orbits: Mild mucosal edema paranasal sinuses.  Normal orbit Other: None IMPRESSION: Small area of acute infarct in the right posterior midbrain just above the fourth ventricle. Chronic ischemic changes in the white matter, basal ganglia, pons, and left cerebellum Electronically Signed   By: Marlan Palau M.D.   On: 03/09/2018 09:36   US Carotid Bilateral  Result Date: 03/09/2018 CLINICAL DATA:   62 year old male with new acute brainstem infarct. EXAM: BILATERAL CAROTID DUPLEX ULTRASOUND TECHNIQUE: Wallace Cullens scale imaging, color Doppler and duplex ultrasound were performed of bilateral carotid and vertebral arteries in the neck. COMPARISON:  Most recent prior carotid duplex ultrasound 05/12/2017 FINDINGS: Criteria: Quantification of carotid stenosis is based on velocity parameters that correlate the residual internal carotid diameter with NASCET-based stenosis levels, using the diameter of the distal internal carotid lumen as the denominator for stenosis measurement. The following velocity measurements were obtained: RIGHT ICA: 72/26 cm/sec CCA: 103/17 cm/sec SYSTOLIC ICA/CCA RATIO:  0.7 ECA:  105 cm/sec LEFT ICA: 73/20 cm/sec CCA: 91/15 cm/sec SYSTOLIC ICA/CCA RATIO:  0.8 ECA:  80 cm/sec RIGHT CAROTID ARTERY: Trace smooth heterogeneous atherosclerotic plaque. By peak systolic velocity criteria, stenosis remains less than 50%. RIGHT VERTEBRAL ARTERY:  Patent with normal antegrade flow. LEFT CAROTID ARTERY: Mild heterogeneous atherosclerotic plaque in the proximal internal carotid artery. By peak systolic velocity criteria, the estimated stenosis remains less than 50%. LEFT VERTEBRAL ARTERY:  Patent with normal antegrade flow. IMPRESSION: 1. Mild (1-49%) stenosis proximal right internal carotid artery secondary to trace heterogeneous atherosclerotic plaque without significant interval progression compared to July 2018. 2. Mild (1-49%) stenosis proximal left internal carotid artery secondary to mild heterogeneous atherosclerotic plaque without significant interval progression. 3. Vertebral arteries remain patent with normal antegrade flow. Signed, Sterling Big, MD [facility] Electronically Signed   By: Malachy Moan M.D.   On: 03/09/2018 13:01    Microbiology: No results found for this or any previous visit (from the past 240 hour(s)).    Labs: Basic Metabolic Panel: Recent Labs  Lab 03/08/18  1950  NA 137  K 3.3*  CL 106  CO2 24  GLUCOSE 129*  BUN 17  CREATININE 1.17  CALCIUM 8.9   Liver Function Tests: Recent Labs  Lab 03/08/18 1950  AST 21  ALT 22  ALKPHOS 59  BILITOT 0.8  PROT 7.1  ALBUMIN 3.5   No results for input(s): LIPASE, AMYLASE in the last 168 hours. No results for input(s): AMMONIA in the last 168 hours. CBC: Recent Labs  Lab 03/08/18 1950  WBC 8.7  NEUTROABS 6.4  HGB 14.1  HCT 42.2  MCV 88.8  PLT 226   Cardiac Enzymes: No results for input(s): CKTOTAL, CKMB, CKMBINDEX, TROPONINI in the last 168 hours. BNP: BNP (last 3 results) No results for input(s): BNP in the last 8760 hours.  ProBNP (last 3 results) No results for input(s): PROBNP in the last 8760 hours.  CBG: No results for input(s): GLUCAP in the last 168 hours.     Signed:  Chaya Jan  [facility] Pager: [phone] 03/09/2018, 6:18 PM

## 2018-03-09 NOTE — Evaluation (Addendum)
Occupational Therapy Evaluation Patient Details Name: Richard Madden MRN: [id number] DOB: 02/13/1956 Today's Date: 03/09/2018    History of Present Illness Richard Madden is a 62 y.o. male with a history of hypertension, hyperlipidemia, history of stroke in 2016.  Patient had presented at that time with multiple falls with the MRI showing left cerebellar subacute infarcts.  Additionally, the patient had been seen at [facility] for diplopia and was diagnosed with a 3rd nerve palsy on the right.  MRI did not show any acute focal abnormality.Patient continues to have diplopia.  His dizziness is worse when he was up and moving around, although continues to have some dizziness at rest.  This is improved since receiving the 2 L of IV fluids.  He also reports difficulty in controlling the movements of his left eye.  He did have a headache earlier, although this is improved.  He reports no changes to his blood pressure medications over the past couple of weeks, other than that which was done at [facility] earlier today.   Clinical Impression   Pt in bed and agreeable to participate in OT services. Patient at this time is functioning at baseline and does not require any additional OT services. Patient will be safe to return home. I do recommend that patient obtain a shower chair at home.     Follow Up Recommendations  No OT follow up    Equipment Recommendations  Tub/shower seat       Precautions / Restrictions Precautions Precautions: None Restrictions Weight Bearing Restrictions: No      Mobility Bed Mobility Overal bed mobility: Independent                Transfers Overall transfer level: Independent Equipment used: None                      ADL either performed or assessed with clinical judgement   ADL Overall ADL's : Independent;At baseline             Vision Baseline Vision/History: No visual deficits Patient Visual Report: No change from  baseline Vision Assessment?: No apparent visual deficits Additional Comments: Pt reports that his diplopia has resolved since he initially arrived to the hospital.             Pertinent Vitals/Pain Pain Assessment: 0-10 Pain Score: 4  Pain Location: left side of head Pain Descriptors / Indicators: Aching Pain Intervention(s): Patient requesting pain meds-RN notified     Hand Dominance Right   Extremity/Trunk Assessment Upper Extremity Assessment Upper Extremity Assessment: Overall WFL for tasks assessed   Lower Extremity Assessment Lower Extremity Assessment: Defer to PT evaluation       Communication Communication Communication: No difficulties   Cognition Arousal/Alertness: Awake/alert Behavior During Therapy: WFL for tasks assessed/performed Overall Cognitive Status: Within Functional Limits for tasks assessed                        Home Living Family/patient expects to be discharged to:: Private residence Living Arrangements: Alone Available Help at Discharge: Family;Available PRN/intermittently Type of Home: House Home Access: Stairs to enter [employer] of Steps: 3 Entrance Stairs-Rails: Right Home Layout: One level     Bathroom Shower/Tub: Producer, television/film/video: Standard     Home Equipment: None   Additional Comments: Pt reports that he has a walking stick that he uses outside of the home.       Prior  Functioning/Environment Level of Independence: Independent                          OT Goals(Current goals can be found in the care plan section) Acute Rehab OT Goals Patient Stated Goal: To go home.  OT Frequency:             Co-evaluation PT/OT/SLP Co-Evaluation/Treatment: Yes Reason for Co-Treatment: To address functional/ADL transfers   OT goals addressed during session: ADL's and self-care;Strengthening/ROM      AM-PAC PT "6 Clicks" Daily Activity     Outcome Measure Help from another person  eating meals?: None Help from another person taking care of personal grooming?: None Help from another person toileting, which includes using toliet, bedpan, or urinal?: None Help from another person bathing (including washing, rinsing, drying)?: None Help from another person to put on and taking off regular upper body clothing?: None Help from another person to put on and taking off regular lower body clothing?: None 6 Click Score: 24   End of Session Equipment Utilized During Treatment: Gait belt Nurse Communication: Patient requests pain meds  Activity Tolerance: Patient tolerated treatment well Patient left: Other (comment);in bed;with call bell/phone within reach(Physical therapist)  OT Visit Diagnosis: Muscle weakness (generalized) (M62.81)                Time: 1610-9604 OT Time Calculation (min): 24 min Charges:  OT General Charges $OT Visit: 1 Visit OT Evaluation $OT Eval Low Complexity: 1 Low G-Codes:     Limmie Patricia, OTR/L,CBIS  [phone]   Tamarick Kovalcik, Charisse March 03/09/2018, 9:04 AM

## 2018-03-09 NOTE — Evaluation (Signed)
Physical Therapy Evaluation Patient Details Name: Richard Madden MRN: [id number] DOB: [birth year] Today's Date: 03/09/2018   History of Present Illness  Richard Madden is a 62 y.o. male with a history of hypertension, hyperlipidemia, history of stroke in 2016.  Patient had presented at that time with multiple falls with the MRI showing left cerebellar subacute infarcts.  Additionally, the patient had been seen at [facility] for diplopia and was diagnosed with a 3rd nerve palsy on the right.  MRI did not show any acute focal abnormality.Patient continues to have diplopia.  His dizziness is worse when he was up and moving around, although continues to have some dizziness at rest.  This is improved since receiving the 2 L of IV fluids.  He also reports difficulty in controlling the movements of his left eye.  He did have a headache earlier, although this is improved.  He reports no changes to his blood pressure medications over the past couple of weeks, other than that which was done at [facility] earlier today.    Clinical Impression  Patient functioning at baseline for functional mobility and gait other than tendency to lean on walls for support when having an episode of double vision in left eye, otherwise patient is at baseline.  Plan:  Patient discharged from physical therapy to care of nursing for ambulation daily as tolerated for length of stay.     Follow Up Recommendations No PT follow up    Equipment Recommendations  None recommended by PT    Recommendations for Other Services       Precautions / Restrictions Precautions Precautions: Fall Precaution Comments: possible fall risk when having episodes of double vision Restrictions Weight Bearing Restrictions: No      Mobility  Bed Mobility Overal bed mobility: Independent                Transfers Overall transfer level: Independent Equipment used: None                Ambulation/Gait Ambulation/Gait  assistance: Modified independent (Device/Increase time) Ambulation Distance (Feet): 200 Feet Assistive device: None Gait Pattern/deviations: WFL(Within Functional Limits) Gait velocity: normal   General Gait Details: grossly WFL except tendency to lean on walls once having episode of double vision in left eye wehn returning to his room, other than that at baseline  Stairs Stairs: Yes Stairs assistance: Modified independent (Device/Increase time) Stair Management: Two rails;Alternating pattern Number of Stairs: 9 General stair comments: Patient demonstrates good return for going up/down 9 steps using bilateral siderails without loss of balance  Wheelchair Mobility    Modified Rankin (Stroke Patients Only)       Balance Overall balance assessment: No apparent balance deficits (not formally assessed)                                           Pertinent Vitals/Pain Pain Assessment: 0-10 Pain Score: 4  Pain Location: left side of head Pain Descriptors / Indicators: Aching Pain Intervention(s): Limited activity within patient's tolerance;Monitored during session    Home Living Family/patient expects to be discharged to:: Private residence Living Arrangements: Alone Available Help at Discharge: Family;Available PRN/intermittently Type of Home: House Home Access: Stairs to enter Entrance Stairs-Rails: Right Entrance Stairs-Number of Steps: 3 Home Layout: One level Home Equipment: Other (comment)(walking stick) Additional Comments: Pt reports that he has a walking stick that he uses  outside of the home.     Prior Function Level of Independence: Independent with assistive device(s)         Comments: household ambulation without assistive device, uses walking stick for community     Hand Dominance   Dominant Hand: Right    Extremity/Trunk Assessment   Upper Extremity Assessment Upper Extremity Assessment: Defer to OT evaluation    Lower Extremity  Assessment Lower Extremity Assessment: Overall WFL for tasks assessed    Cervical / Trunk Assessment Cervical / Trunk Assessment: Normal  Communication   Communication: No difficulties  Cognition Arousal/Alertness: Awake/alert Behavior During Therapy: WFL for tasks assessed/performed Overall Cognitive Status: Within Functional Limits for tasks assessed                                        General Comments      Exercises     Assessment/Plan    PT Assessment Patent does not need any further PT services  PT Problem List         PT Treatment Interventions      PT Goals (Current goals can be found in the Care Plan section)  Acute Rehab PT Goals Patient Stated Goal: To go home. PT Goal Formulation: With patient Time For Goal Achievement: Mar 17, 2018 Potential to Achieve Goals: Good    Frequency     Barriers to discharge        Co-evaluation PT/OT/SLP Co-Evaluation/Treatment: Yes Reason for Co-Treatment: Complexity of the patient's impairments (multi-system involvement) PT goals addressed during session: Mobility/safety with mobility;Balance OT goals addressed during session: ADL's and self-care;Strengthening/ROM       AM-PAC PT "6 Clicks" Daily Activity  Outcome Measure Difficulty turning over in bed (including adjusting bedclothes, sheets and blankets)?: None Difficulty moving from lying on back to sitting on the side of the bed? : None Difficulty sitting down on and standing up from a chair with arms (e.g., wheelchair, bedside commode, etc,.)?: None Help needed moving to and from a bed to chair (including a wheelchair)?: None Help needed walking in hospital room?: None Help needed climbing 3-5 steps with a railing? : None 6 Click Score: 24    End of Session Equipment Utilized During Treatment: Gait belt Activity Tolerance: Patient tolerated treatment well Patient left: in bed;with call bell/phone within reach;with bed alarm set Nurse  Communication: Mobility status PT Visit Diagnosis: Unsteadiness on feet (R26.81);Other abnormalities of gait and mobility (R26.89);Muscle weakness (generalized) (M62.81)    Time: 6213-0865 PT Time Calculation (min) (ACUTE ONLY): 23 min   Charges:   PT Evaluation $PT Eval Moderate Complexity: 1 Mod PT Treatments $Therapeutic Activity: 23-37 mins   PT G Codes:        10:49 AM, Mar 17, 2018 Ocie Bob, MPT Physical Therapist with [facility] [phone] office [phone] mobile phone
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
## 2018-03-10 LAB — HOMOCYSTEINE: Homocysteine: 8.2 umol/L (ref 0.0–15.0)

## 2018-03-10 LAB — HIV ANTIBODY (ROUTINE TESTING W REFLEX): HIV Screen 4th Generation wRfx: NONREACTIVE

## 2018-03-10 LAB — RPR: RPR: NONREACTIVE

## 2018-08-27 ENCOUNTER — Ambulatory Visit: Payer: Medicaid Other | Admitting: Neurology

## 2018-08-27 ENCOUNTER — Encounter: Payer: Self-pay | Admitting: Neurology

## 2018-08-27 VITALS — BP 140/82 | HR 69 | Ht 65.0 in | Wt 134.0 lb

## 2018-08-27 DIAGNOSIS — I6381 Other cerebral infarction due to occlusion or stenosis of small artery: Secondary | ICD-10-CM

## 2018-08-27 NOTE — Patient Instructions (Signed)
I had a long d/w patient and his daughter about his recent lacunar stroke, risk for recurrent stroke/TIAs, personally independently reviewed imaging studies and stroke evaluation results and answered questions.Continue Plavix 75 mg daily for secondary stroke prevention and maintain strict control of hypertension with blood pressure goal below 130/90, diabetes with hemoglobin A1c goal below 6.5% and lipids with LDL cholesterol goal below 70 mg/dL. I also advised the patient to eat a healthy diet with plenty of whole grains, cereals, fruits and vegetables, exercise regularly and maintain ideal body weight . Check f/u carotid and transcranial doppler studiesFollowup in the future with my nurse practitioner Shanda Bumps in 6 months or call earlier if necessary   Stroke Prevention Some medical conditions and behaviors are associated with a higher chance of having a stroke. You can help prevent a stroke by making nutrition, lifestyle, and other changes, including managing any medical conditions you may have. What nutrition changes can be made?  Eat healthy foods. You can do this by: ? Choosing foods high in fiber, such as fresh fruits and vegetables and whole grains. ? Eating at least 5 or more servings of fruits and vegetables a day. Try to fill half of your plate at each meal with fruits and vegetables. ? Choosing lean protein foods, such as lean cuts of meat, poultry without skin, fish, tofu, beans, and nuts. ? Eating low-fat dairy products. ? Avoiding foods that are high in salt (sodium). This can help lower blood pressure. ? Avoiding foods that have saturated fat, trans fat, and cholesterol. This can help prevent high cholesterol. ? Avoiding processed and premade foods.  Follow your health care provider's specific guidelines for losing weight, controlling high blood pressure (hypertension), lowering high cholesterol, and managing diabetes. These may include: ? Reducing your daily calorie  intake. ? Limiting your daily sodium intake to 1,500 milligrams (mg). ? Using only healthy fats for cooking, such as olive oil, canola oil, or sunflower oil. ? Counting your daily carbohydrate intake. What lifestyle changes can be made?  Maintain a healthy weight. Talk to your health care provider about your ideal weight.  Get at least 30 minutes of moderate physical activity at least 5 days a week. Moderate activity includes brisk walking, biking, and swimming.  Do not use any products that contain nicotine or tobacco, such as cigarettes and e-cigarettes. If you need help quitting, ask your health care provider. It may also be helpful to avoid exposure to secondhand smoke.  Limit alcohol intake to no more than 1 drink a day for nonpregnant women and 2 drinks a day for men. One drink equals 12 oz of beer, 5 oz of wine, or 1 oz of hard liquor.  Stop any illegal drug use.  Avoid taking birth control pills. Talk to your health care provider about the risks of taking birth control pills if: ? You are over 74 years old. ? You smoke. ? You get migraines. ? You have ever had a blood clot. What other changes can be made?  Manage your cholesterol levels. ? Eating a healthy diet is important for preventing high cholesterol. If cholesterol cannot be managed through diet alone, you may also need to take medicines. ? Take any prescribed medicines to control your cholesterol as told by your health care provider.  Manage your diabetes. ? Eating a healthy diet and exercising regularly are important parts of managing your blood sugar. If your blood sugar cannot be managed through diet and exercise, you may need to take medicines. ?  Take any prescribed medicines to control your diabetes as told by your health care provider.  Control your hypertension. ? To reduce your risk of stroke, try to keep your blood pressure below 130/80. ? Eating a healthy diet and exercising regularly are an important part  of controlling your blood pressure. If your blood pressure cannot be managed through diet and exercise, you may need to take medicines. ? Take any prescribed medicines to control hypertension as told by your health care provider. ? Ask your health care provider if you should monitor your blood pressure at home. ? Have your blood pressure checked every year, even if your blood pressure is normal. Blood pressure increases with age and some medical conditions.  Get evaluated for sleep disorders (sleep apnea). Talk to your health care provider about getting a sleep evaluation if you snore a lot or have excessive sleepiness.  Take over-the-counter and prescription medicines only as told by your health care provider. Aspirin or blood thinners (antiplatelets or anticoagulants) may be recommended to reduce your risk of forming blood clots that can lead to stroke.  Make sure that any other medical conditions you have, such as atrial fibrillation or atherosclerosis, are managed. What are the warning signs of a stroke? The warning signs of a stroke can be easily remembered as BEFAST.  B is for balance. Signs include: ? Dizziness. ? Loss of balance or coordination. ? Sudden trouble walking.  E is for eyes. Signs include: ? A sudden change in vision. ? Trouble seeing.  F is for face. Signs include: ? Sudden weakness or numbness of the face. ? The face or eyelid drooping to one side.  A is for arms. Signs include: ? Sudden weakness or numbness of the arm, usually on one side of the body.  S is for speech. Signs include: ? Trouble speaking (aphasia). ? Trouble understanding.  T is for time. ? These symptoms may represent a serious problem that is an emergency. Do not wait to see if the symptoms will go away. Get medical help right away. Call your local emergency services (911 in the [place] Do not drive yourself to the hospital.  Other signs of stroke may include: ? A sudden, severe headache  with no known cause. ? Nausea or vomiting. ? Seizure.  Where to find more information: For more information, visit:  American Stroke Association: www.strokeassociation.org  National Stroke Association: www.stroke.org  Summary  You can prevent a stroke by eating healthy, exercising, not smoking, limiting alcohol intake, and managing any medical conditions you may have.  Do not use any products that contain nicotine or tobacco, such as cigarettes and e-cigarettes. If you need help quitting, ask your health care provider. It may also be helpful to avoid exposure to secondhand smoke.  Remember BEFAST for warning signs of stroke. Get help right away if you or a loved one has any of these signs. This information is not intended to replace advice given to you by your health care provider. Make sure you discuss any questions you have with your health care provider. Document Released: 11/28/2004 Document Revised: 11/26/2016 Document Reviewed: 11/26/2016 Elsevier Interactive Patient Education  [employer].
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
## 2019-02-02 IMAGING — US US CAROTID DUPLEX BILAT
1 series · 13 of 24 positions shown · non-contrast
Comparison: Most recent prior carotid duplex ultrasound 05/12/2017

CLINICAL DATA: 61-year-old male with new acute brainstem infarct.

EXAM:
BILATERAL CAROTID DUPLEX ULTRASOUND
TECHNIQUE: Gray scale imaging, color Doppler and duplex ultrasound were
performed of bilateral carotid and vertebral arteries in the neck.

[Series 1: us carotid duplex bilat · 0.05mm/px · 13 of 82 slices shown]
[im 1/82]
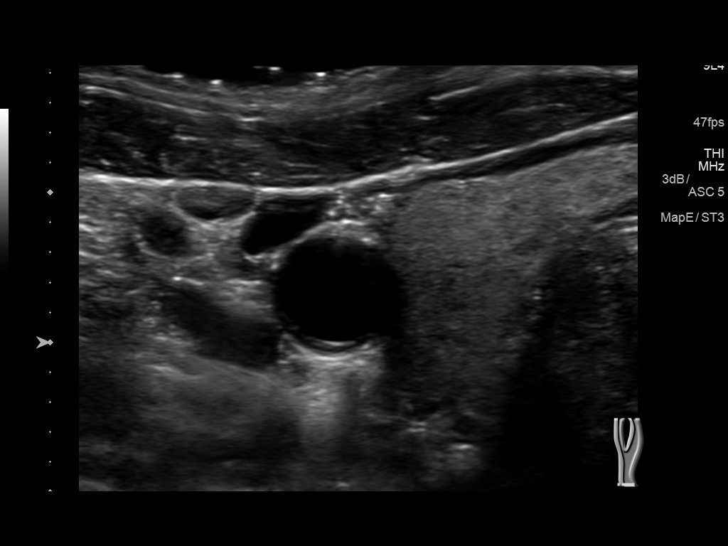
[im 8/82]
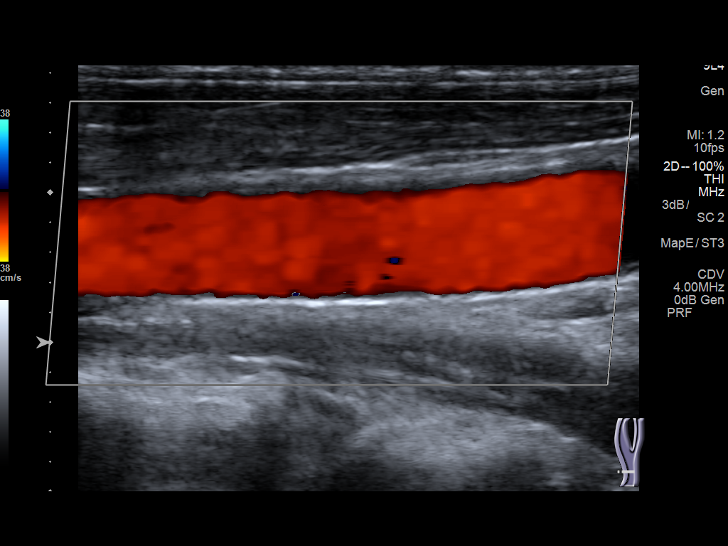
[im 15/82]
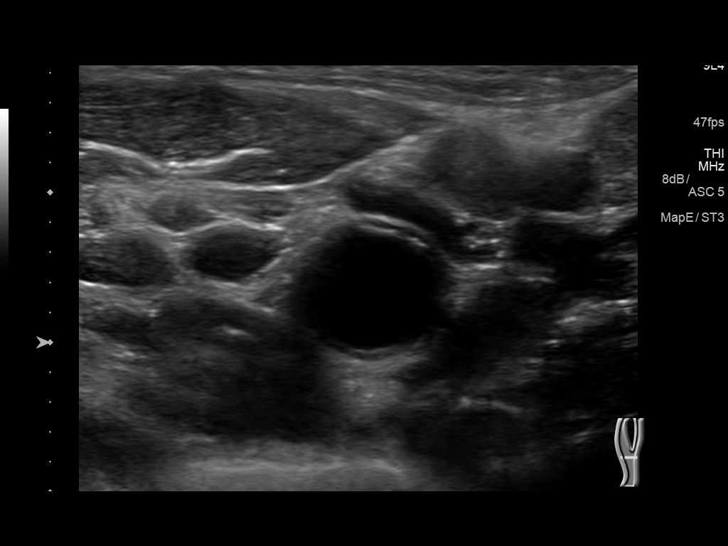
[im 22/82]
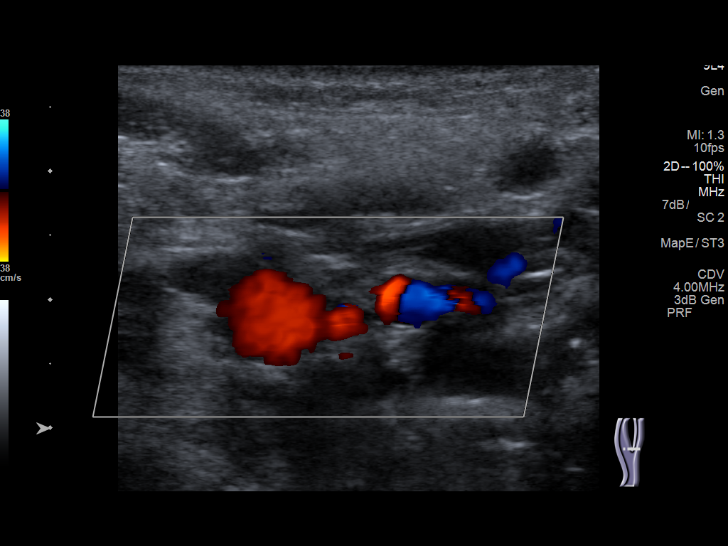
[im 29/82]
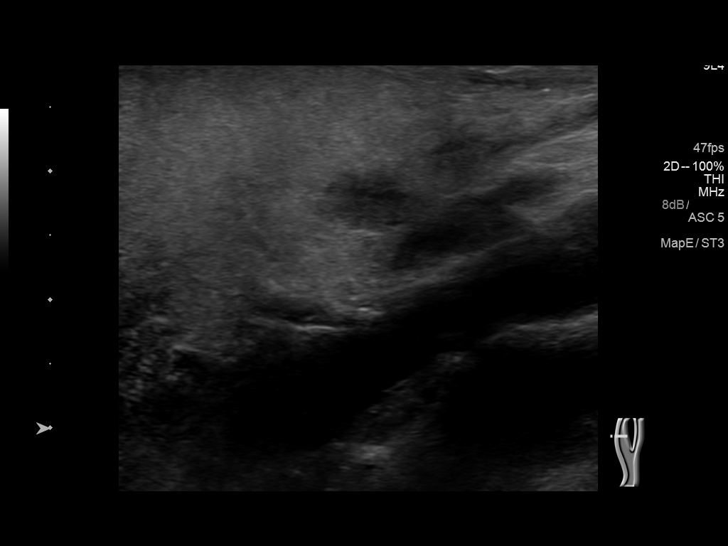
[im 36/82]
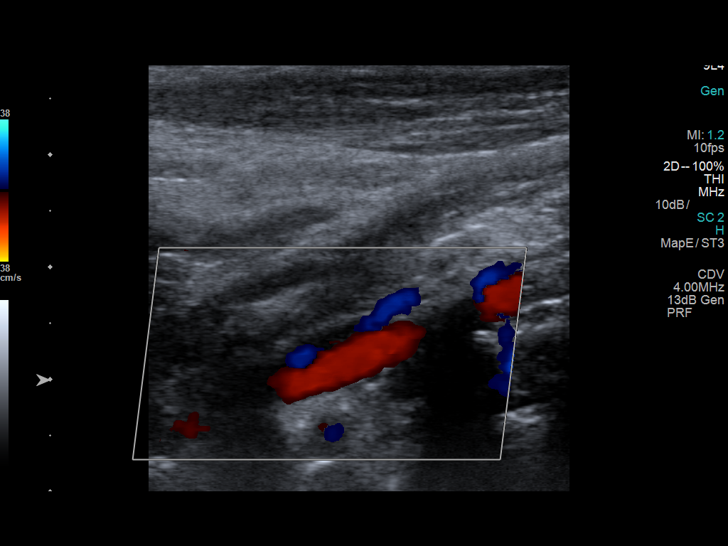
[im 43/82]
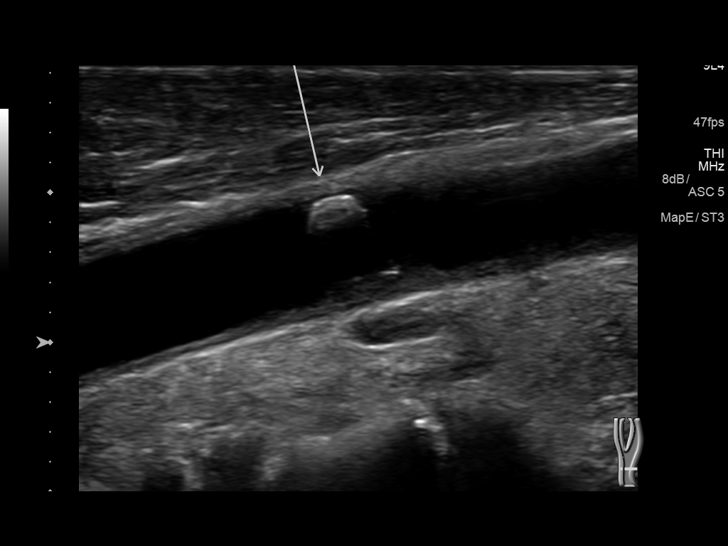
[im 46/82]
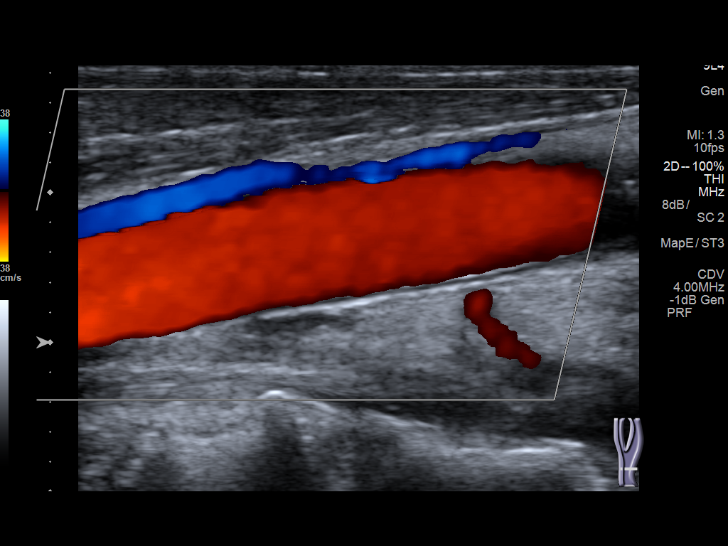
[im 53/82]
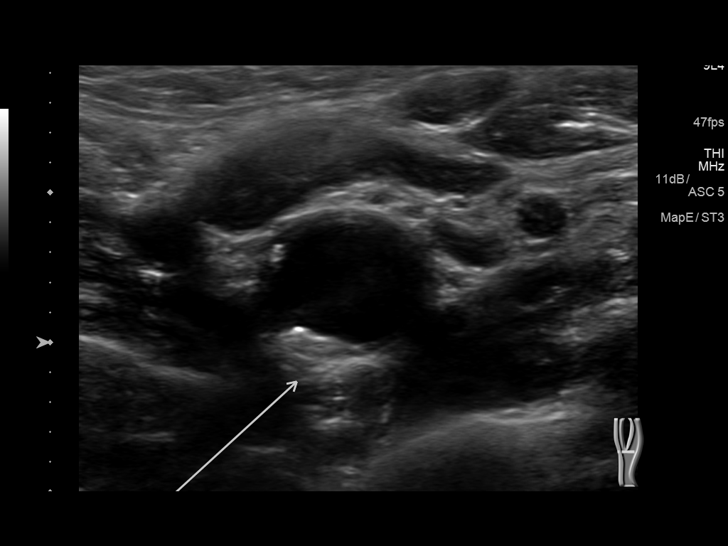
[im 60/82]
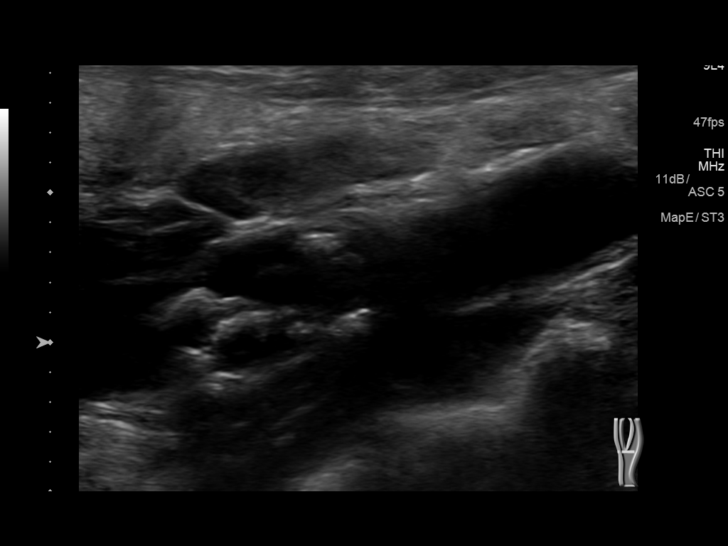
[im 67/82]
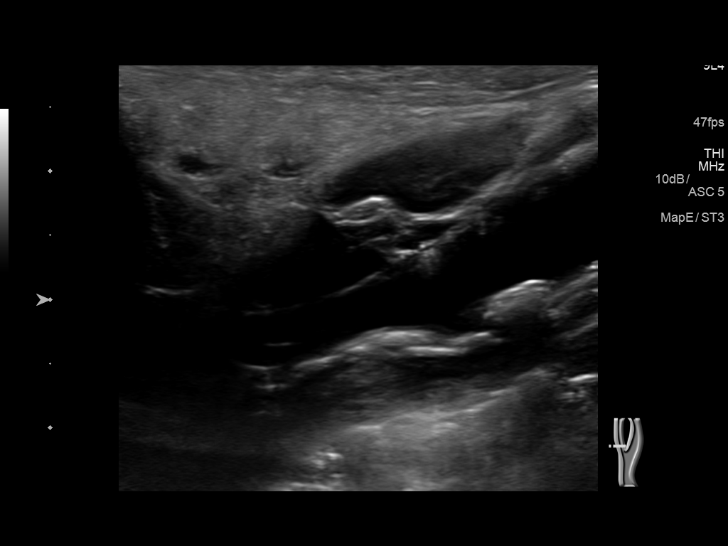
[im 74/82]
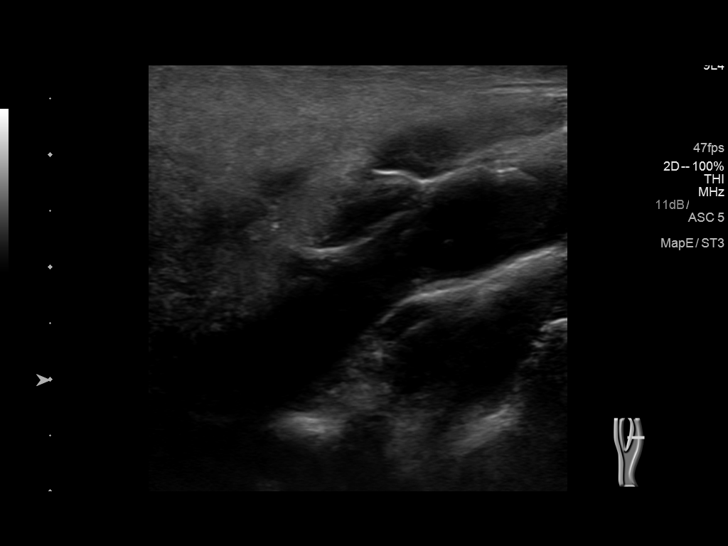
[im 82/82]
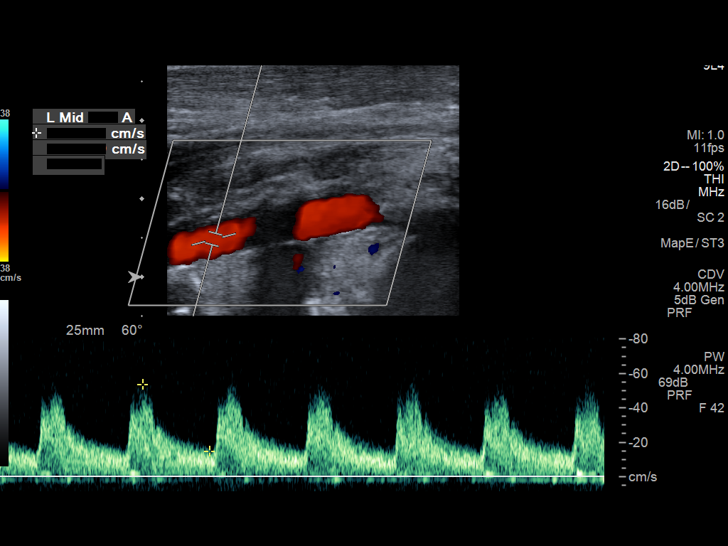

[13 of 24 positions shown; findings below may reference images not displayed]

FINDINGS: Criteria: Quantification of carotid stenosis is based on velocity
parameters that correlate the residual internal carotid diameter
with NASCET-based stenosis levels, using the diameter of the distal
internal carotid lumen as the denominator for stenosis measurement.

The following velocity measurements were obtained:

RIGHT
ICA: 72/26 cm/sec
CCA: 103/17 cm/sec

SYSTOLIC ICA/CCA RATIO:

ECA:  105 cm/sec

LEFT

ICA: 73/20 cm/sec

CCA: 91/15 cm/sec

SYSTOLIC ICA/CCA RATIO:

ECA:  80 cm/sec

RIGHT CAROTID ARTERY: Trace smooth heterogeneous atherosclerotic
plaque. By peak systolic velocity criteria, stenosis remains less
than 50%.

RIGHT VERTEBRAL ARTERY:  Patent with normal antegrade flow.

LEFT CAROTID ARTERY: Mild heterogeneous atherosclerotic plaque in
the proximal internal carotid artery. By peak systolic velocity
criteria, the estimated stenosis remains less than 50%.

LEFT VERTEBRAL ARTERY:  Patent with normal antegrade flow.
IMPRESSION: 1. Mild (1-49%) stenosis proximal right internal carotid artery
secondary to trace heterogeneous atherosclerotic plaque without
significant interval progression compared to May 2017.
2. Mild (1-49%) stenosis proximal left internal carotid artery
secondary to mild heterogeneous atherosclerotic plaque without
significant interval progression.
3. Vertebral arteries remain patent with normal antegrade flow.

## 2019-03-01 ENCOUNTER — Telehealth: Payer: Self-pay | Admitting: Adult Health

## 2019-03-01 NOTE — Telephone Encounter (Signed)
Due to current COVID 19 pandemic, our office is severely reducing in office visits for at least the next 2 weeks, in order to minimize the risk to our patients and healthcare providers. Pt understands that although there may be some limitations with this type of visit, we will take all precautions to reduce any security or privacy concerns.  Pt understands that this will be treated like an in office visit and we will file with pt's insurance, and there may be a patient responsible charge related to this service. Pt's best contact is [phone]. Pt has a flip phone unable to have a vv

## 2019-03-03 NOTE — Progress Notes (Signed)
  [facility] [address] [place]. [place] [address] [phone]     Virtual Visit via Telephone Note  I connected with Richard Madden on 03/03/19 at  2:15 PM EDT by telephone located remotely within my own home and verified that I am speaking with the correct person using two identifiers who reports being located in his own home.    I discussed the limitations, risks, security and privacy concerns of performing an evaluation and management service by telephone and the availability of in person appointments. I also discussed with the patient that there may be a patient responsible charge related to this service. The patient expressed understanding and agreed to proceed.   History of Present Illness:  Richard Madden is a 63 y.o. male who has been followed in this office for lacunar midbrain infarct in 03/2018.  He was initially scheduled for face-to-face office stroke follow-up visit today but due to COVID19, face-to-face office visit rescheduled for non-face-to-face telephone visit.  He has been stable from a stroke standpoint with residual deficit of occasional gait and balance difficulties.  He continues to live alone and is able to perform all ADLs and IADLs independently.  Continues on clopidogrel and zocor without reported side effects. Visits PCP next month for repeat lab work.  Blood pressure stable.  No further concerns at this time.     Observations/Objective:  General:  Pleasant middle-aged African-American male asking and answering questions appropriately throughout conversation Mental Status: Awake and fully alert. Oriented to place and time. Recent and remote memory intact. Attention span, concentration and fund of knowledge appropriate. Mood and affect appropriate.     Assessment and Plan:  Richard Madden is a 63 y.o. African-American male with recent lacunar midbrain infarct in May 2019 with prior history of brainstem and cerebellar lacunar  infarcts in 2016.  Vascular risk factors of hypertension, hyperlipidemia, intracranial and extracranial stenosis, prior strokes within the right thalamus, pons, cerebellum and extensive changes of chronic small vessel disease.  He has been stable from a stroke standpoint with residual deficit of occasional balance difficulties.  -Continue Plavix 75 mg and atorvastatin for secondary stroke prevention -Continue to follow with PCP for HTN and HLD management -Continue to stay active and maintain a healthy diet -Monitor BP at home -maintain strict control of hypertension with blood pressure goal below 130/90, diabetes with hemoglobin A1c goal below 6.5% and lipids with LDL cholesterol goal below 70 mg/dL. I also advised the patient to eat a healthy diet with plenty of whole grains, cereals, fruits and vegetables, exercise regularly and maintain ideal body weight .   Follow Up Instructions:   Stable from stroke standpoint and recommend follow-up as needed but advised to call office with questions or concerns regarding stroke management    I discussed the assessment and treatment plan with the patient.  The patient was provided an opportunity to ask questions and all were answered to their satisfaction. The patient agreed with the plan and verbalized an understanding of the instructions.   I provided 23 minutes of non-face-to-face time during this encounter.    George Hugh, AGNP-BC  [facility] [address] [place], [place] [address]  Phone [phone] Fax [phone] Note: This document was prepared with digital dictation and possible smart phrase technology. Any transcriptional errors that result from this process are unintentional.

## 2019-03-04 ENCOUNTER — Other Ambulatory Visit: Payer: Self-pay

## 2019-03-04 ENCOUNTER — Encounter: Payer: Self-pay | Admitting: Adult Health

## 2019-03-04 ENCOUNTER — Ambulatory Visit (INDEPENDENT_AMBULATORY_CARE_PROVIDER_SITE_OTHER): Payer: Medicaid Other | Admitting: Adult Health

## 2019-03-04 DIAGNOSIS — I69398 Other sequelae of cerebral infarction: Secondary | ICD-10-CM

## 2019-03-04 DIAGNOSIS — R2689 Other abnormalities of gait and mobility: Secondary | ICD-10-CM

## 2019-03-04 DIAGNOSIS — E785 Hyperlipidemia, unspecified: Secondary | ICD-10-CM | POA: Diagnosis not present

## 2019-03-04 DIAGNOSIS — I10 Essential (primary) hypertension: Secondary | ICD-10-CM | POA: Diagnosis not present

## 2019-03-04 DIAGNOSIS — I6381 Other cerebral infarction due to occlusion or stenosis of small artery: Secondary | ICD-10-CM

## 2019-03-04 NOTE — Progress Notes (Signed)
I agree with the above plan 

## 2019-05-05 IMAGING — MR MR HEAD W/O CM
8 of 10 series · 40 of 48 positions shown · non-contrast
Comparison: CT head 03/08/2018.  MRI 10/17/2017

CLINICAL DATA: Ataxia.  Left-sided weakness.  Stroke.

EXAM:
MRI HEAD WITHOUT CONTRAST
TECHNIQUE: Multiplanar, multiecho pulse sequences of the brain and surrounding
structures were obtained without intravenous contrast.

[Series 4: DWI · axial · 3.0mm · 0.82mm/px · z∈[-69,+69]mm · 6 of 48 slices shown (1 of 4)]
[im 1/48]
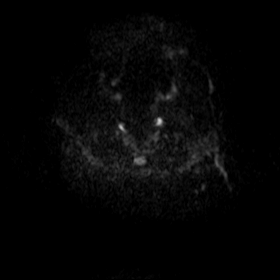
[im 10/48]
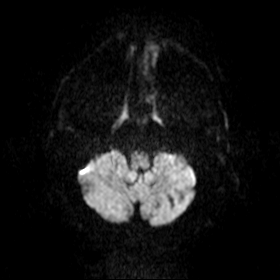
[im 19/48]
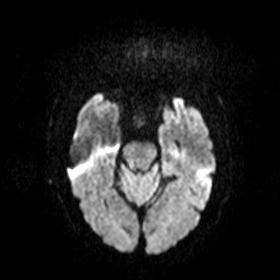
[im 29/48]
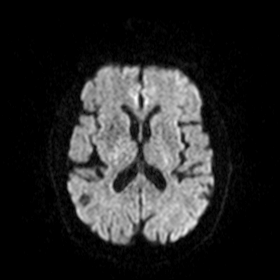
[im 38/48]
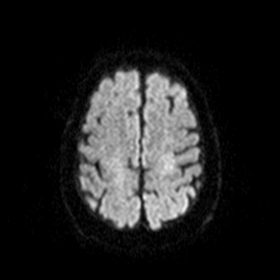
[im 48/48]
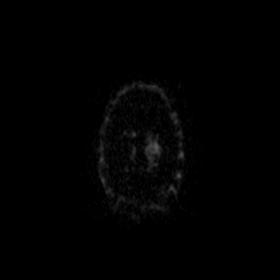

[Series 5: DWI · axial · 3.0mm · 0.82mm/px · z∈[-69,+69]mm · 6 of 47 slices shown (2 of 4)]
[im 1/47]
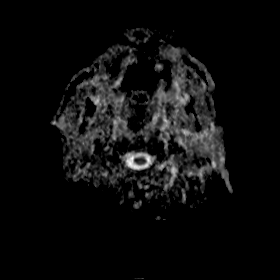
[im 10/47]
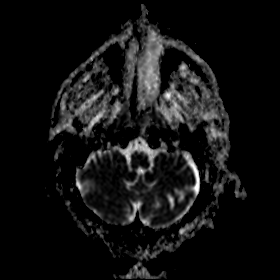
[im 19/47]
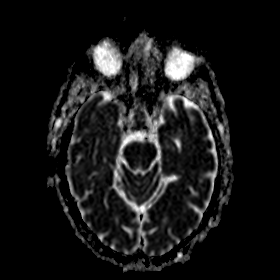
[im 28/47]
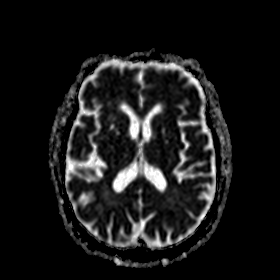
[im 37/47]
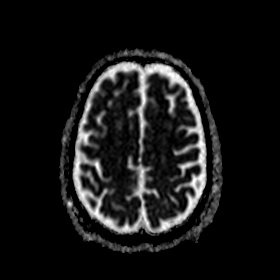
[im 47/47]
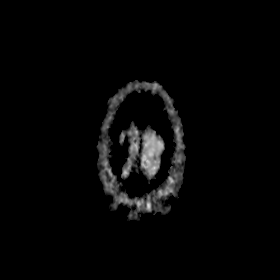

[Series 6: DWI · coronal · 5.0mm · 0.48mm/px · 4 of 34 slices shown (3 of 4)]
[im 1/34]
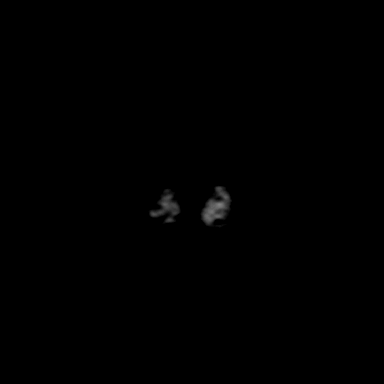
[im 12/34]
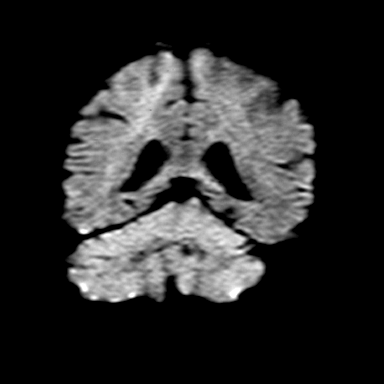
[im 23/34]
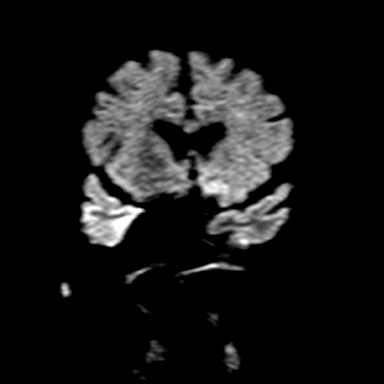
[im 34/34]
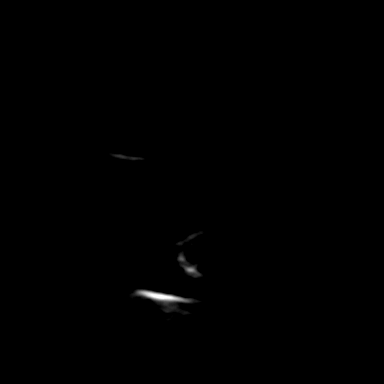

[Series 7: DWI · coronal · 5.0mm · 0.48mm/px · 4 of 34 slices shown (4 of 4)]
[im 1/34]
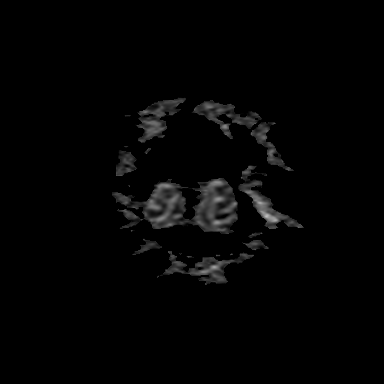
[im 12/34]
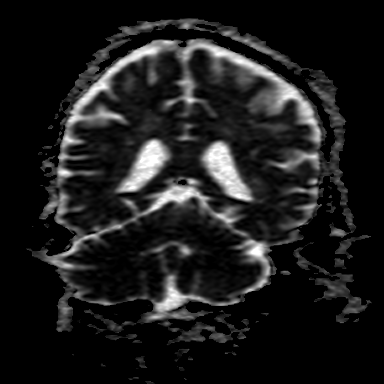
[im 23/34]
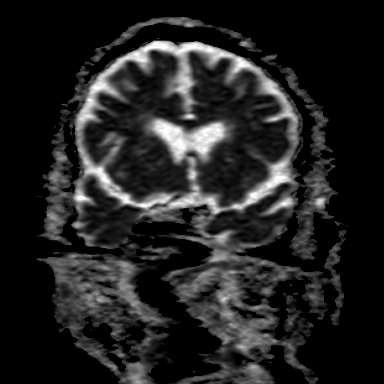
[im 34/34]
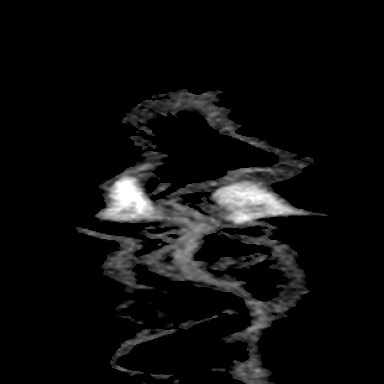

[Series 8: T2 · axial · 5.0mm · 0.75mm/px · z∈[-70,+71]mm · 3 of 23 slices shown (1 of 2)]
[im 1/23]
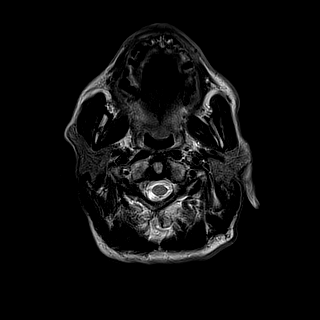
[im 12/23]
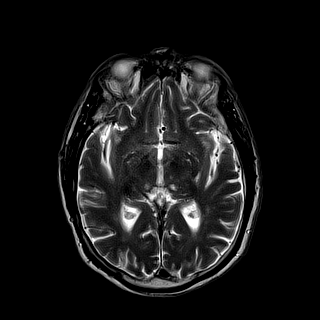
[im 23/23]
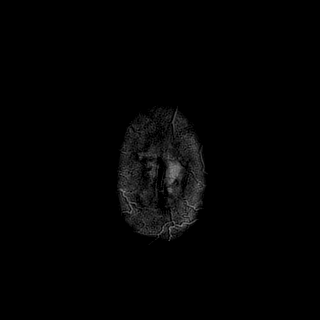

[Series 9: FLAIR · axial · 3.0mm · 0.83mm/px · z∈[-68,+67]mm · 6 of 47 slices shown]
[im 1/47]
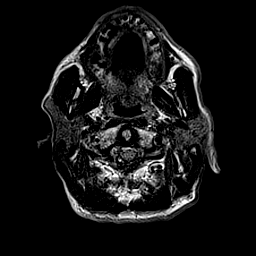
[im 10/47]
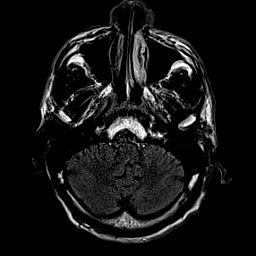
[im 19/47]
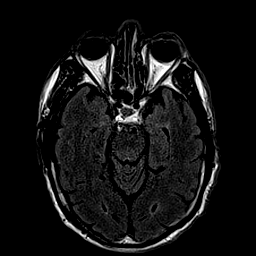
[im 28/47]
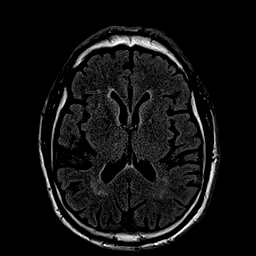
[im 37/47]
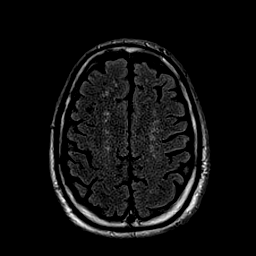
[im 47/47]
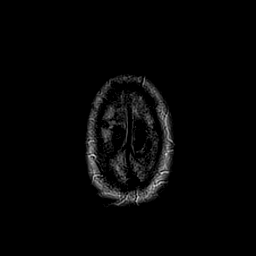

[Series 10: T1 · axial · 2.0mm · 0.47mm/px · z∈[-81,+104]mm · 8 of 95 slices shown]
[im 1/95]
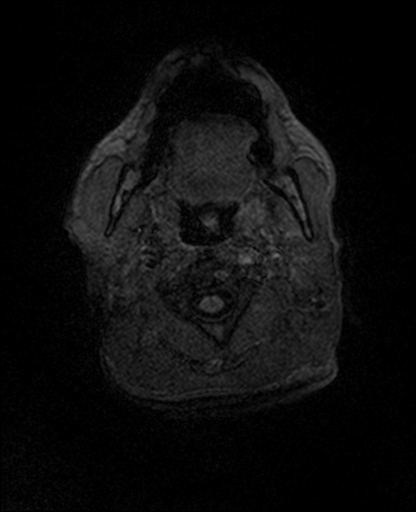
[im 19/95]
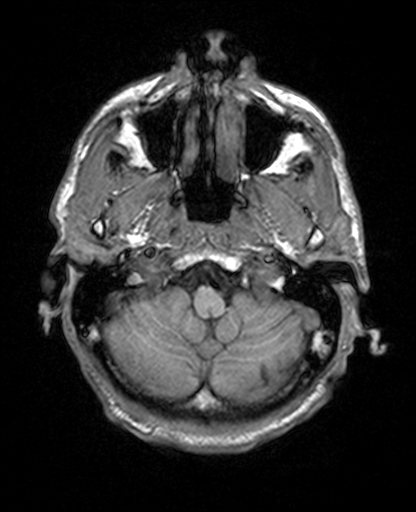
[im 29/95]
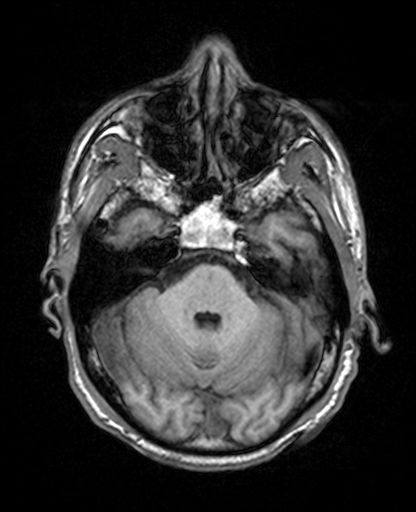
[im 38/95]
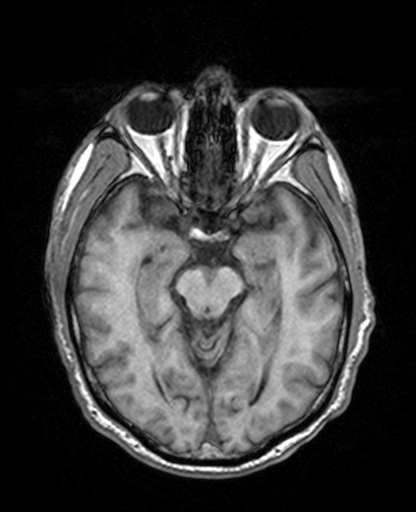
[im 57/95]
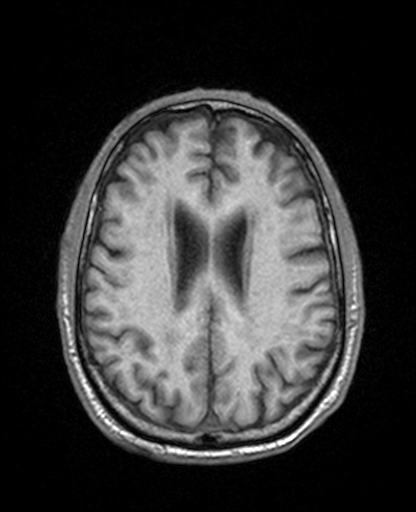
[im 66/95]
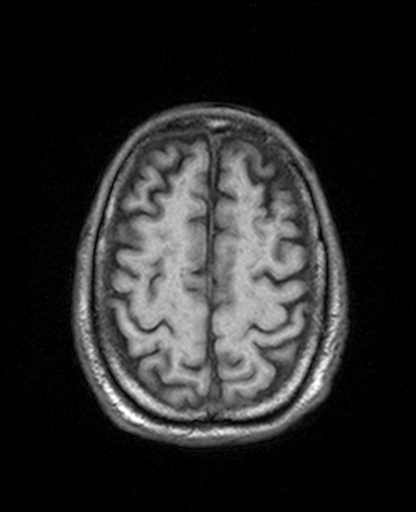
[im 76/95]
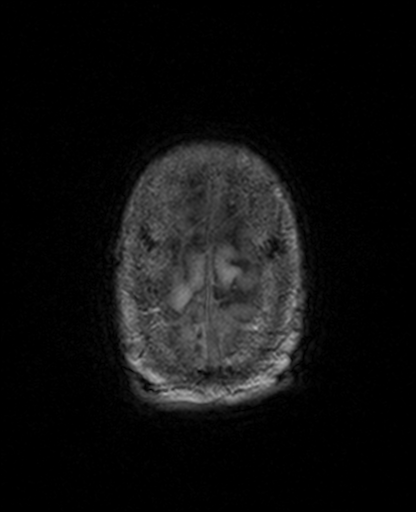
[im 95/95]
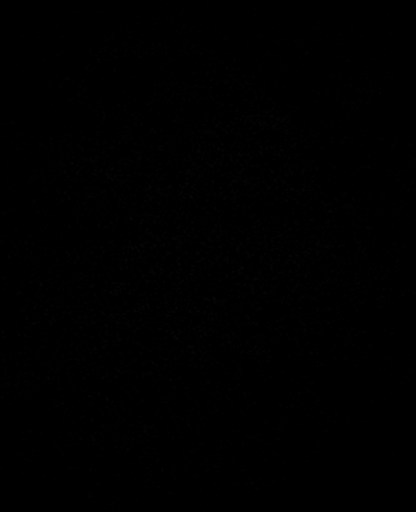

[Series 12: T2 · coronal · 5.0mm · 0.57mm/px · 3 of 28 slices shown (2 of 2)]
[im 1/28]
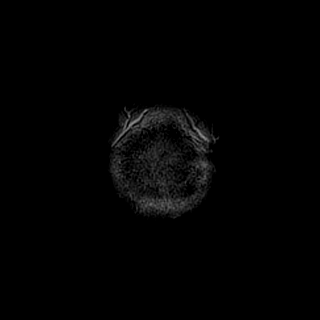
[im 14/28]
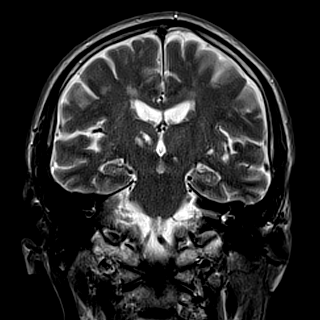
[im 28/28]
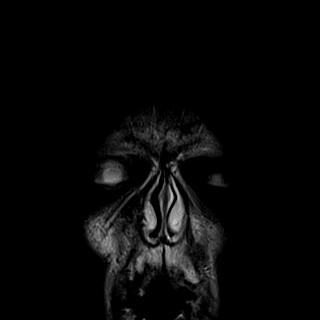

[40 of 48 positions shown; findings below may reference images not displayed]

FINDINGS: Brain: Small acute infarct in the right posterior midbrain at the
level the upper fourth ventricle. No other acute infarct identified.

Mild atrophy. Chronic microvascular ischemic change throughout the
white matter. Chronic infarcts in the thalamus and pons bilaterally.
Chronic infarcts in the left cerebellum. Negative for hemorrhage or
mass lesion.

Vascular: Normal arterial flow voids

Skull and upper cervical spine: Negative

Sinuses/Orbits: Mild mucosal edema paranasal sinuses.  Normal orbit

Other: None
IMPRESSION: Small area of acute infarct in the right posterior midbrain just
above the fourth ventricle.

Chronic ischemic changes in the white matter, basal ganglia, pons,
and left cerebellum

## 2019-12-16 ENCOUNTER — Emergency Department (HOSPITAL_COMMUNITY): Payer: Medicare Other

## 2019-12-16 ENCOUNTER — Encounter (HOSPITAL_COMMUNITY): Payer: Self-pay

## 2019-12-16 ENCOUNTER — Emergency Department (HOSPITAL_COMMUNITY)
Admission: EM | Admit: 2019-12-16 | Discharge: 2019-12-16 | Disposition: A | Discharge status: Home / Self Care | Payer: Medicare Other | Attending: Emergency Medicine | Admitting: Emergency Medicine

## 2019-12-16 ENCOUNTER — Other Ambulatory Visit: Payer: Self-pay

## 2019-12-16 DIAGNOSIS — Z20822 Contact with and (suspected) exposure to covid-19: Secondary | ICD-10-CM | POA: Diagnosis not present

## 2019-12-16 DIAGNOSIS — Z79899 Other long term (current) drug therapy: Secondary | ICD-10-CM | POA: Diagnosis not present

## 2019-12-16 DIAGNOSIS — I10 Essential (primary) hypertension: Secondary | ICD-10-CM

## 2019-12-16 DIAGNOSIS — R42 Dizziness and giddiness: Secondary | ICD-10-CM | POA: Diagnosis present

## 2019-12-16 LAB — CBC WITH DIFFERENTIAL/PLATELET
Abs Immature Granulocytes: 0.01 10*3/uL (ref 0.00–0.07)
Basophils Absolute: 0.1 10*3/uL (ref 0.0–0.1)
Basophils Relative: 2 %
Eosinophils Absolute: 0.1 10*3/uL (ref 0.0–0.5)
Eosinophils Relative: 3 %
HCT: 42.7 % (ref 39.0–52.0)
Hemoglobin: 14 g/dL (ref 13.0–17.0)
Immature Granulocytes: 0 %
Lymphocytes Relative: 30 %
Lymphs Abs: 1.4 10*3/uL (ref 0.7–4.0)
MCH: 29.6 pg (ref 26.0–34.0)
MCHC: 32.8 g/dL (ref 30.0–36.0)
MCV: 90.3 fL (ref 80.0–100.0)
Monocytes Absolute: 0.5 10*3/uL (ref 0.1–1.0)
Monocytes Relative: 11 %
Neutro Abs: 2.6 10*3/uL (ref 1.7–7.7)
Neutrophils Relative %: 54 %
Platelets: 203 10*3/uL (ref 150–400)
RBC: 4.73 MIL/uL (ref 4.22–5.81)
RDW: 13.5 % (ref 11.5–15.5)
WBC: 4.7 10*3/uL (ref 4.0–10.5)
nRBC: 0 % (ref 0.0–0.2)

## 2019-12-16 LAB — COMPREHENSIVE METABOLIC PANEL
ALT: 22 U/L (ref 0–44)
AST: 21 U/L (ref 15–41)
Albumin: 4 g/dL (ref 3.5–5.0)
Alkaline Phosphatase: 60 U/L (ref 38–126)
Anion gap: 7 (ref 5–15)
BUN: 16 mg/dL (ref 8–23)
CO2: 25 mmol/L (ref 22–32)
Calcium: 9.1 mg/dL (ref 8.9–10.3)
Chloride: 104 mmol/L (ref 98–111)
Creatinine, Ser: 1.25 mg/dL — ABNORMAL HIGH (ref 0.61–1.24)
GFR calc Af Amer: >60 mL/min (ref >60)
GFR calc non Af Amer: >60 mL/min (ref >60)
Glucose, Bld: 93 mg/dL (ref 70–99)
Potassium: 4 mmol/L (ref 3.5–5.1)
Sodium: 136 mmol/L (ref 135–145)
Total Bilirubin: 0.8 mg/dL (ref 0.3–1.2)
Total Protein: 8 g/dL (ref 6.5–8.1)

## 2019-12-16 LAB — RESPIRATORY PANEL BY RT PCR (FLU A&B, COVID)
Influenza A by PCR: NEGATIVE
Influenza B by PCR: NEGATIVE
SARS Coronavirus 2 by RT PCR: NEGATIVE

## 2019-12-16 LAB — TROPONIN I (HIGH SENSITIVITY)
Troponin I (High Sensitivity): <2 ng/L (ref <18)
Troponin I (High Sensitivity): <2 ng/L (ref <18)

## 2019-12-16 LAB — ETHANOL: Alcohol, Ethyl (B): <10 mg/dL (ref <10)

## 2019-12-16 MED ORDER — SODIUM CHLORIDE 0.9 % IV BOLUS
1000.0000 mL | Freq: Once | INTRAVENOUS | Status: AC
  Administered 2019-12-16: 16:00:00 1000 mL via INTRAVENOUS
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
## 2019-12-16 NOTE — Discharge Instructions (Addendum)
Drink plenty of fluids and follow-up with your doctor next week for recheck °

## 2019-12-16 NOTE — ED Triage Notes (Signed)
Pt presents to ED with complaints of dizziness x 2 days, double vision. Pt states he was walking into the wall

## 2019-12-16 NOTE — ED Notes (Signed)
Pt transported to MRI 

## 2020-09-05 ENCOUNTER — Emergency Department (HOSPITAL_COMMUNITY): Payer: Medicare Other

## 2020-09-05 ENCOUNTER — Other Ambulatory Visit: Payer: Self-pay

## 2020-09-05 ENCOUNTER — Emergency Department (HOSPITAL_COMMUNITY)
Admission: EM | Admit: 2020-09-05 | Discharge: 2020-09-05 | Disposition: A | Discharge status: Home / Self Care | Payer: Medicare Other | Attending: Emergency Medicine | Admitting: Emergency Medicine

## 2020-09-05 DIAGNOSIS — Z87891 Personal history of nicotine dependence: Secondary | ICD-10-CM | POA: Insufficient documentation

## 2020-09-05 DIAGNOSIS — I10 Essential (primary) hypertension: Secondary | ICD-10-CM | POA: Diagnosis not present

## 2020-09-05 DIAGNOSIS — Z79899 Other long term (current) drug therapy: Secondary | ICD-10-CM | POA: Diagnosis not present

## 2020-09-05 DIAGNOSIS — I951 Orthostatic hypotension: Secondary | ICD-10-CM | POA: Insufficient documentation

## 2020-09-05 DIAGNOSIS — R55 Syncope and collapse: Secondary | ICD-10-CM | POA: Diagnosis present

## 2020-09-05 LAB — COMPREHENSIVE METABOLIC PANEL
ALT: 17 U/L (ref 0–44)
AST: 19 U/L (ref 15–41)
Albumin: 4.3 g/dL (ref 3.5–5.0)
Alkaline Phosphatase: 53 U/L (ref 38–126)
Anion gap: 9 (ref 5–15)
BUN: 22 mg/dL (ref 8–23)
CO2: 22 mmol/L (ref 22–32)
Calcium: 9.2 mg/dL (ref 8.9–10.3)
Chloride: 103 mmol/L (ref 98–111)
Creatinine, Ser: 1.3 mg/dL — ABNORMAL HIGH (ref 0.61–1.24)
GFR, Estimated: >60 mL/min (ref >60)
Glucose, Bld: 94 mg/dL (ref 70–99)
Potassium: 3.5 mmol/L (ref 3.5–5.1)
Sodium: 134 mmol/L — ABNORMAL LOW (ref 135–145)
Total Bilirubin: 1 mg/dL (ref 0.3–1.2)
Total Protein: 8.5 g/dL — ABNORMAL HIGH (ref 6.5–8.1)

## 2020-09-05 LAB — CBC WITH DIFFERENTIAL/PLATELET
Abs Immature Granulocytes: 0.02 10*3/uL (ref 0.00–0.07)
Basophils Absolute: 0.1 10*3/uL (ref 0.0–0.1)
Basophils Relative: 1 %
Eosinophils Absolute: 0.1 10*3/uL (ref 0.0–0.5)
Eosinophils Relative: 1 %
HCT: 41.9 % (ref 39.0–52.0)
Hemoglobin: 14 g/dL (ref 13.0–17.0)
Immature Granulocytes: 0 %
Lymphocytes Relative: 22 %
Lymphs Abs: 1.7 10*3/uL (ref 0.7–4.0)
MCH: 30.6 pg (ref 26.0–34.0)
MCHC: 33.4 g/dL (ref 30.0–36.0)
MCV: 91.7 fL (ref 80.0–100.0)
Monocytes Absolute: 0.7 10*3/uL (ref 0.1–1.0)
Monocytes Relative: 9 %
Neutro Abs: 5.5 10*3/uL (ref 1.7–7.7)
Neutrophils Relative %: 67 %
Platelets: 239 10*3/uL (ref 150–400)
RBC: 4.57 MIL/uL (ref 4.22–5.81)
RDW: 13.1 % (ref 11.5–15.5)
WBC: 8.1 10*3/uL (ref 4.0–10.5)
nRBC: 0 % (ref 0.0–0.2)

## 2020-09-05 LAB — URINALYSIS, ROUTINE W REFLEX MICROSCOPIC
Bacteria, UA: NONE SEEN
Bilirubin Urine: NEGATIVE
Glucose, UA: 150 mg/dL — AB
Ketones, ur: NEGATIVE mg/dL
Leukocytes,Ua: NEGATIVE
Nitrite: NEGATIVE
Protein, ur: NEGATIVE mg/dL
Specific Gravity, Urine: 1.009 (ref 1.005–1.030)
pH: 5 (ref 5.0–8.0)

## 2020-09-05 LAB — TROPONIN I (HIGH SENSITIVITY)
Troponin I (High Sensitivity): 4 ng/L (ref <18)
Troponin I (High Sensitivity): 5 ng/L (ref <18)

## 2020-09-05 LAB — CBG MONITORING, ED: Glucose-Capillary: 83 mg/dL (ref 70–99)

## 2020-09-05 MED ORDER — SODIUM CHLORIDE 0.9 % IV BOLUS
1000.0000 mL | Freq: Once | INTRAVENOUS | Status: AC
  Administered 2020-09-05: 1000 mL via INTRAVENOUS

## 2020-09-05 MED ORDER — MEDICAL COMPRESSION SOCKS MISC: 1.0000 | Freq: Every day | 0 refills | Status: AC

## 2020-09-05 NOTE — ED Notes (Signed)
Juice and crackers given to pt

## 2020-09-05 NOTE — ED Triage Notes (Signed)
Pt work up this morning when to the restroom and blacked out per pt.  No one witnessed.  Pt hurt right side of foot when he fell.
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
## 2020-09-05 NOTE — Discharge Instructions (Signed)
Hold your metoprolol until you see your doctor.

## 2020-09-05 NOTE — ED Notes (Signed)
Ambulated pt. 60 ft. In the hallway. Pt. Denies feeling dizzy or that they will pass out.

## 2022-12-02 DIAGNOSIS — I10 Essential (primary) hypertension: Secondary | ICD-10-CM | POA: Diagnosis not present

## 2022-12-02 DIAGNOSIS — E785 Hyperlipidemia, unspecified: Secondary | ICD-10-CM | POA: Diagnosis not present

## 2022-12-02 DIAGNOSIS — E039 Hypothyroidism, unspecified: Secondary | ICD-10-CM | POA: Diagnosis not present

## 2022-12-13 DIAGNOSIS — Z8673 Personal history of transient ischemic attack (TIA), and cerebral infarction without residual deficits: Secondary | ICD-10-CM | POA: Diagnosis not present

## 2022-12-13 DIAGNOSIS — K5900 Constipation, unspecified: Secondary | ICD-10-CM | POA: Diagnosis not present

## 2022-12-13 DIAGNOSIS — Z681 Body mass index (BMI) 19 or less, adult: Secondary | ICD-10-CM | POA: Diagnosis not present

## 2022-12-13 DIAGNOSIS — J309 Allergic rhinitis, unspecified: Secondary | ICD-10-CM | POA: Diagnosis not present

## 2022-12-13 DIAGNOSIS — I10 Essential (primary) hypertension: Secondary | ICD-10-CM | POA: Diagnosis not present

## 2022-12-13 DIAGNOSIS — R55 Syncope and collapse: Secondary | ICD-10-CM | POA: Diagnosis not present

## 2022-12-13 DIAGNOSIS — E785 Hyperlipidemia, unspecified: Secondary | ICD-10-CM | POA: Diagnosis not present

## 2022-12-17 DIAGNOSIS — I10 Essential (primary) hypertension: Secondary | ICD-10-CM | POA: Diagnosis not present

## 2022-12-17 DIAGNOSIS — R55 Syncope and collapse: Secondary | ICD-10-CM | POA: Diagnosis not present

## 2022-12-17 DIAGNOSIS — I2510 Atherosclerotic heart disease of native coronary artery without angina pectoris: Secondary | ICD-10-CM | POA: Diagnosis not present

## 2022-12-20 DIAGNOSIS — Z8673 Personal history of transient ischemic attack (TIA), and cerebral infarction without residual deficits: Secondary | ICD-10-CM | POA: Diagnosis not present

## 2022-12-20 DIAGNOSIS — K5900 Constipation, unspecified: Secondary | ICD-10-CM | POA: Diagnosis not present

## 2022-12-20 DIAGNOSIS — J309 Allergic rhinitis, unspecified: Secondary | ICD-10-CM | POA: Diagnosis not present

## 2022-12-20 DIAGNOSIS — E785 Hyperlipidemia, unspecified: Secondary | ICD-10-CM | POA: Diagnosis not present

## 2022-12-20 DIAGNOSIS — R55 Syncope and collapse: Secondary | ICD-10-CM | POA: Diagnosis not present

## 2022-12-20 DIAGNOSIS — I10 Essential (primary) hypertension: Secondary | ICD-10-CM | POA: Diagnosis not present

## 2022-12-20 DIAGNOSIS — Z681 Body mass index (BMI) 19 or less, adult: Secondary | ICD-10-CM | POA: Diagnosis not present

## 2023-03-10 DIAGNOSIS — I10 Essential (primary) hypertension: Secondary | ICD-10-CM | POA: Diagnosis not present

## 2023-03-10 DIAGNOSIS — E7800 Pure hypercholesterolemia, unspecified: Secondary | ICD-10-CM | POA: Diagnosis not present

## 2023-03-10 DIAGNOSIS — Z91198 Patient's noncompliance with other medical treatment and regimen for other reason: Secondary | ICD-10-CM | POA: Diagnosis not present

## 2023-03-10 DIAGNOSIS — N1832 Chronic kidney disease, stage 3b: Secondary | ICD-10-CM | POA: Diagnosis not present

## 2023-03-10 DIAGNOSIS — J449 Chronic obstructive pulmonary disease, unspecified: Secondary | ICD-10-CM | POA: Diagnosis not present

## 2023-03-10 DIAGNOSIS — Z87891 Personal history of nicotine dependence: Secondary | ICD-10-CM | POA: Diagnosis not present

## 2023-03-10 DIAGNOSIS — R7301 Impaired fasting glucose: Secondary | ICD-10-CM | POA: Diagnosis not present

## 2023-03-22 DIAGNOSIS — I959 Hypotension, unspecified: Secondary | ICD-10-CM | POA: Diagnosis not present

## 2023-03-22 DIAGNOSIS — R55 Syncope and collapse: Secondary | ICD-10-CM | POA: Diagnosis not present

## 2023-03-22 DIAGNOSIS — N179 Acute kidney failure, unspecified: Secondary | ICD-10-CM | POA: Diagnosis not present

## 2023-03-22 DIAGNOSIS — I10 Essential (primary) hypertension: Secondary | ICD-10-CM | POA: Diagnosis not present

## 2023-03-22 DIAGNOSIS — Z8673 Personal history of transient ischemic attack (TIA), and cerebral infarction without residual deficits: Secondary | ICD-10-CM | POA: Diagnosis not present

## 2023-03-22 DIAGNOSIS — E785 Hyperlipidemia, unspecified: Secondary | ICD-10-CM | POA: Diagnosis not present

## 2023-03-22 DIAGNOSIS — I2510 Atherosclerotic heart disease of native coronary artery without angina pectoris: Secondary | ICD-10-CM | POA: Diagnosis not present

## 2023-03-22 DIAGNOSIS — Z9981 Dependence on supplemental oxygen: Secondary | ICD-10-CM | POA: Diagnosis not present

## 2023-03-22 DIAGNOSIS — R42 Dizziness and giddiness: Secondary | ICD-10-CM | POA: Diagnosis not present

## 2023-03-22 DIAGNOSIS — Z87891 Personal history of nicotine dependence: Secondary | ICD-10-CM | POA: Diagnosis not present

## 2023-03-22 DIAGNOSIS — F1090 Alcohol use, unspecified, uncomplicated: Secondary | ICD-10-CM | POA: Diagnosis not present

## 2023-03-22 DIAGNOSIS — Z7902 Long term (current) use of antithrombotics/antiplatelets: Secondary | ICD-10-CM | POA: Diagnosis not present

## 2023-03-22 DIAGNOSIS — Z79899 Other long term (current) drug therapy: Secondary | ICD-10-CM | POA: Diagnosis not present

## 2023-03-22 DIAGNOSIS — J449 Chronic obstructive pulmonary disease, unspecified: Secondary | ICD-10-CM | POA: Diagnosis not present

## 2023-03-23 DIAGNOSIS — E785 Hyperlipidemia, unspecified: Secondary | ICD-10-CM | POA: Diagnosis not present

## 2023-03-23 DIAGNOSIS — R55 Syncope and collapse: Secondary | ICD-10-CM | POA: Diagnosis not present

## 2023-03-23 DIAGNOSIS — Z8673 Personal history of transient ischemic attack (TIA), and cerebral infarction without residual deficits: Secondary | ICD-10-CM | POA: Diagnosis not present

## 2023-03-23 DIAGNOSIS — I959 Hypotension, unspecified: Secondary | ICD-10-CM | POA: Diagnosis not present

## 2023-03-23 DIAGNOSIS — N179 Acute kidney failure, unspecified: Secondary | ICD-10-CM | POA: Diagnosis not present

## 2023-03-23 DIAGNOSIS — I2510 Atherosclerotic heart disease of native coronary artery without angina pectoris: Secondary | ICD-10-CM | POA: Diagnosis not present

## 2023-03-25 DIAGNOSIS — R55 Syncope and collapse: Secondary | ICD-10-CM | POA: Diagnosis not present

## 2023-03-25 DIAGNOSIS — I2510 Atherosclerotic heart disease of native coronary artery without angina pectoris: Secondary | ICD-10-CM | POA: Diagnosis not present

## 2023-03-25 DIAGNOSIS — E782 Mixed hyperlipidemia: Secondary | ICD-10-CM | POA: Diagnosis not present

## 2023-04-10 DIAGNOSIS — R42 Dizziness and giddiness: Secondary | ICD-10-CM | POA: Diagnosis not present

## 2023-04-22 DIAGNOSIS — F1091 Alcohol use, unspecified, in remission: Secondary | ICD-10-CM | POA: Diagnosis not present

## 2023-04-22 DIAGNOSIS — N179 Acute kidney failure, unspecified: Secondary | ICD-10-CM | POA: Diagnosis not present

## 2023-04-22 DIAGNOSIS — R55 Syncope and collapse: Secondary | ICD-10-CM | POA: Diagnosis not present

## 2023-04-22 DIAGNOSIS — I959 Hypotension, unspecified: Secondary | ICD-10-CM | POA: Diagnosis not present

## 2023-04-22 DIAGNOSIS — Z8673 Personal history of transient ischemic attack (TIA), and cerebral infarction without residual deficits: Secondary | ICD-10-CM | POA: Diagnosis not present

## 2023-04-22 DIAGNOSIS — E785 Hyperlipidemia, unspecified: Secondary | ICD-10-CM | POA: Diagnosis not present

## 2023-08-27 DIAGNOSIS — S0990XA Unspecified injury of head, initial encounter: Secondary | ICD-10-CM | POA: Diagnosis not present

## 2023-08-27 DIAGNOSIS — E785 Hyperlipidemia, unspecified: Secondary | ICD-10-CM | POA: Diagnosis not present

## 2023-08-27 DIAGNOSIS — Z79899 Other long term (current) drug therapy: Secondary | ICD-10-CM | POA: Diagnosis not present

## 2023-08-27 DIAGNOSIS — R079 Chest pain, unspecified: Secondary | ICD-10-CM | POA: Diagnosis not present

## 2023-08-27 DIAGNOSIS — R3121 Asymptomatic microscopic hematuria: Secondary | ICD-10-CM | POA: Diagnosis not present

## 2023-08-27 DIAGNOSIS — I6782 Cerebral ischemia: Secondary | ICD-10-CM | POA: Diagnosis not present

## 2023-08-27 DIAGNOSIS — Z87891 Personal history of nicotine dependence: Secondary | ICD-10-CM | POA: Diagnosis not present

## 2023-08-27 DIAGNOSIS — Z8673 Personal history of transient ischemic attack (TIA), and cerebral infarction without residual deficits: Secondary | ICD-10-CM | POA: Diagnosis not present

## 2023-08-27 DIAGNOSIS — I491 Atrial premature depolarization: Secondary | ICD-10-CM | POA: Diagnosis not present

## 2023-08-27 DIAGNOSIS — W19XXXA Unspecified fall, initial encounter: Secondary | ICD-10-CM | POA: Diagnosis not present

## 2023-08-27 DIAGNOSIS — I951 Orthostatic hypotension: Secondary | ICD-10-CM | POA: Diagnosis not present

## 2023-08-27 DIAGNOSIS — I6789 Other cerebrovascular disease: Secondary | ICD-10-CM | POA: Diagnosis not present

## 2023-08-27 DIAGNOSIS — R0789 Other chest pain: Secondary | ICD-10-CM | POA: Diagnosis not present

## 2023-08-27 DIAGNOSIS — Z7902 Long term (current) use of antithrombotics/antiplatelets: Secondary | ICD-10-CM | POA: Diagnosis not present

## 2023-08-27 DIAGNOSIS — Z23 Encounter for immunization: Secondary | ICD-10-CM | POA: Diagnosis not present

## 2023-08-27 DIAGNOSIS — N179 Acute kidney failure, unspecified: Secondary | ICD-10-CM | POA: Diagnosis not present

## 2023-08-27 DIAGNOSIS — R319 Hematuria, unspecified: Secondary | ICD-10-CM | POA: Diagnosis not present

## 2023-08-27 DIAGNOSIS — R519 Headache, unspecified: Secondary | ICD-10-CM | POA: Diagnosis not present

## 2023-08-27 DIAGNOSIS — I6381 Other cerebral infarction due to occlusion or stenosis of small artery: Secondary | ICD-10-CM | POA: Diagnosis not present

## 2023-08-28 DIAGNOSIS — R55 Syncope and collapse: Secondary | ICD-10-CM | POA: Diagnosis not present

## 2023-08-28 DIAGNOSIS — E860 Dehydration: Secondary | ICD-10-CM | POA: Diagnosis not present

## 2023-08-28 DIAGNOSIS — N179 Acute kidney failure, unspecified: Secondary | ICD-10-CM | POA: Diagnosis not present

## 2023-08-28 DIAGNOSIS — R319 Hematuria, unspecified: Secondary | ICD-10-CM | POA: Diagnosis not present

## 2023-09-08 DIAGNOSIS — I2510 Atherosclerotic heart disease of native coronary artery without angina pectoris: Secondary | ICD-10-CM | POA: Diagnosis not present

## 2023-09-08 DIAGNOSIS — R2689 Other abnormalities of gait and mobility: Secondary | ICD-10-CM | POA: Diagnosis not present

## 2023-09-08 DIAGNOSIS — D509 Iron deficiency anemia, unspecified: Secondary | ICD-10-CM | POA: Diagnosis not present

## 2023-09-08 DIAGNOSIS — E785 Hyperlipidemia, unspecified: Secondary | ICD-10-CM | POA: Diagnosis not present

## 2023-09-08 DIAGNOSIS — N179 Acute kidney failure, unspecified: Secondary | ICD-10-CM | POA: Diagnosis not present

## 2023-09-08 DIAGNOSIS — Z8673 Personal history of transient ischemic attack (TIA), and cerebral infarction without residual deficits: Secondary | ICD-10-CM | POA: Diagnosis not present

## 2023-09-27 DIAGNOSIS — I2510 Atherosclerotic heart disease of native coronary artery without angina pectoris: Secondary | ICD-10-CM | POA: Diagnosis not present

## 2023-09-27 DIAGNOSIS — N179 Acute kidney failure, unspecified: Secondary | ICD-10-CM | POA: Diagnosis not present

## 2023-09-27 DIAGNOSIS — R2689 Other abnormalities of gait and mobility: Secondary | ICD-10-CM | POA: Diagnosis not present

## 2023-09-27 DIAGNOSIS — E785 Hyperlipidemia, unspecified: Secondary | ICD-10-CM | POA: Diagnosis not present

## 2023-09-27 DIAGNOSIS — Z8673 Personal history of transient ischemic attack (TIA), and cerebral infarction without residual deficits: Secondary | ICD-10-CM | POA: Diagnosis not present

## 2023-10-06 DIAGNOSIS — I951 Orthostatic hypotension: Secondary | ICD-10-CM | POA: Diagnosis not present

## 2023-10-06 DIAGNOSIS — Z9181 History of falling: Secondary | ICD-10-CM | POA: Diagnosis not present

## 2023-12-01 DIAGNOSIS — E782 Mixed hyperlipidemia: Secondary | ICD-10-CM | POA: Diagnosis not present

## 2023-12-01 DIAGNOSIS — I951 Orthostatic hypotension: Secondary | ICD-10-CM | POA: Diagnosis not present

## 2023-12-01 DIAGNOSIS — R55 Syncope and collapse: Secondary | ICD-10-CM | POA: Diagnosis not present

## 2023-12-29 DIAGNOSIS — N179 Acute kidney failure, unspecified: Secondary | ICD-10-CM | POA: Diagnosis not present

## 2023-12-29 DIAGNOSIS — Z8673 Personal history of transient ischemic attack (TIA), and cerebral infarction without residual deficits: Secondary | ICD-10-CM | POA: Diagnosis not present

## 2023-12-29 DIAGNOSIS — I2510 Atherosclerotic heart disease of native coronary artery without angina pectoris: Secondary | ICD-10-CM | POA: Diagnosis not present

## 2023-12-29 DIAGNOSIS — R030 Elevated blood-pressure reading, without diagnosis of hypertension: Secondary | ICD-10-CM | POA: Diagnosis not present

## 2023-12-29 DIAGNOSIS — Z87898 Personal history of other specified conditions: Secondary | ICD-10-CM | POA: Diagnosis not present

## 2023-12-29 DIAGNOSIS — E785 Hyperlipidemia, unspecified: Secondary | ICD-10-CM | POA: Diagnosis not present

## 2023-12-29 DIAGNOSIS — R5383 Other fatigue: Secondary | ICD-10-CM | POA: Diagnosis not present

## 2023-12-29 DIAGNOSIS — Z6820 Body mass index (BMI) 20.0-20.9, adult: Secondary | ICD-10-CM | POA: Diagnosis not present

## 2023-12-30 DIAGNOSIS — H35033 Hypertensive retinopathy, bilateral: Secondary | ICD-10-CM | POA: Diagnosis not present

## 2024-06-28 DIAGNOSIS — E785 Hyperlipidemia, unspecified: Secondary | ICD-10-CM | POA: Diagnosis not present

## 2024-06-28 DIAGNOSIS — D649 Anemia, unspecified: Secondary | ICD-10-CM | POA: Diagnosis not present

## 2024-06-28 DIAGNOSIS — Z87898 Personal history of other specified conditions: Secondary | ICD-10-CM | POA: Diagnosis not present

## 2024-06-28 DIAGNOSIS — Z8673 Personal history of transient ischemic attack (TIA), and cerebral infarction without residual deficits: Secondary | ICD-10-CM | POA: Diagnosis not present

## 2024-06-28 DIAGNOSIS — E559 Vitamin D deficiency, unspecified: Secondary | ICD-10-CM | POA: Diagnosis not present

## 2024-06-28 DIAGNOSIS — Z6820 Body mass index (BMI) 20.0-20.9, adult: Secondary | ICD-10-CM | POA: Diagnosis not present

## 2024-06-28 DIAGNOSIS — I2510 Atherosclerotic heart disease of native coronary artery without angina pectoris: Secondary | ICD-10-CM | POA: Diagnosis not present

## 2024-06-28 DIAGNOSIS — Z1321 Encounter for screening for nutritional disorder: Secondary | ICD-10-CM | POA: Diagnosis not present

## 2024-07-12 DIAGNOSIS — I2510 Atherosclerotic heart disease of native coronary artery without angina pectoris: Secondary | ICD-10-CM | POA: Diagnosis not present

## 2024-07-12 DIAGNOSIS — Z87898 Personal history of other specified conditions: Secondary | ICD-10-CM | POA: Diagnosis not present

## 2024-07-12 DIAGNOSIS — Z8673 Personal history of transient ischemic attack (TIA), and cerebral infarction without residual deficits: Secondary | ICD-10-CM | POA: Diagnosis not present

## 2024-07-12 DIAGNOSIS — E785 Hyperlipidemia, unspecified: Secondary | ICD-10-CM | POA: Diagnosis not present

## 2024-07-12 DIAGNOSIS — Z6820 Body mass index (BMI) 20.0-20.9, adult: Secondary | ICD-10-CM | POA: Diagnosis not present

## 2024-07-27 DIAGNOSIS — R55 Syncope and collapse: Secondary | ICD-10-CM | POA: Diagnosis not present

## 2024-07-27 DIAGNOSIS — R079 Chest pain, unspecified: Secondary | ICD-10-CM | POA: Diagnosis not present

## 2024-07-27 DIAGNOSIS — Z6820 Body mass index (BMI) 20.0-20.9, adult: Secondary | ICD-10-CM | POA: Diagnosis not present

## 2024-07-27 DIAGNOSIS — R296 Repeated falls: Secondary | ICD-10-CM | POA: Diagnosis not present

## 2024-07-27 DIAGNOSIS — R4020 Unspecified coma: Secondary | ICD-10-CM | POA: Diagnosis not present

## 2024-07-27 DIAGNOSIS — S098XXA Other specified injuries of head, initial encounter: Secondary | ICD-10-CM | POA: Diagnosis not present

## 2024-07-27 DIAGNOSIS — R0781 Pleurodynia: Secondary | ICD-10-CM | POA: Diagnosis not present

## 2024-07-27 DIAGNOSIS — Z9181 History of falling: Secondary | ICD-10-CM | POA: Diagnosis not present

## 2024-07-30 DIAGNOSIS — R55 Syncope and collapse: Secondary | ICD-10-CM | POA: Diagnosis not present

## 2024-08-18 DIAGNOSIS — I472 Ventricular tachycardia, unspecified: Secondary | ICD-10-CM | POA: Diagnosis not present

## 2024-08-18 DIAGNOSIS — R55 Syncope and collapse: Secondary | ICD-10-CM | POA: Diagnosis not present

## 2024-08-23 DIAGNOSIS — R55 Syncope and collapse: Secondary | ICD-10-CM | POA: Diagnosis not present
